# Patient Record
Sex: Female | Born: 1980
Health system: Southern US, Community
[De-identification: ages and names within clinical notes are randomized; demographics above are authoritative.]

## PROBLEM LIST (undated history)

## (undated) DIAGNOSIS — N189 Chronic kidney disease, unspecified: Secondary | ICD-10-CM

## (undated) DIAGNOSIS — M069 Rheumatoid arthritis, unspecified: Secondary | ICD-10-CM

## (undated) DIAGNOSIS — M1990 Unspecified osteoarthritis, unspecified site: Secondary | ICD-10-CM

## (undated) DIAGNOSIS — T7840XA Allergy, unspecified, initial encounter: Secondary | ICD-10-CM

## (undated) HISTORY — PX: CHOLECYSTECTOMY: SHX55

## (undated) HISTORY — DX: Chronic kidney disease, unspecified: N18.9

## (undated) HISTORY — DX: Rheumatoid arthritis, unspecified: M06.9

## (undated) HISTORY — DX: Allergy, unspecified, initial encounter: T78.40XA

---
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
## 2000-01-15 ENCOUNTER — Encounter: Payer: Self-pay | Admitting: Family Medicine

## 2000-01-15 ENCOUNTER — Ambulatory Visit (HOSPITAL_COMMUNITY): Admission: RE | Admit: 2000-01-15 | Discharge: 2000-01-15 | Payer: Self-pay | Admitting: Family Medicine

## 2000-03-04 ENCOUNTER — Ambulatory Visit (HOSPITAL_COMMUNITY): Admission: RE | Admit: 2000-03-04 | Discharge: 2000-03-04 | Payer: Self-pay | Admitting: Gastroenterology

## 2000-03-11 ENCOUNTER — Encounter: Payer: Self-pay | Admitting: Gastroenterology

## 2000-03-11 ENCOUNTER — Emergency Department (HOSPITAL_COMMUNITY): Admission: EM | Admit: 2000-03-11 | Discharge: 2000-03-11 | Payer: Self-pay | Admitting: Emergency Medicine

## 2000-03-12 ENCOUNTER — Inpatient Hospital Stay (HOSPITAL_COMMUNITY): Admission: EM | Admit: 2000-03-12 | Discharge: 2000-03-14 | Payer: Self-pay | Admitting: *Deleted

## 2000-03-12 ENCOUNTER — Ambulatory Visit (HOSPITAL_COMMUNITY): Admission: RE | Admit: 2000-03-12 | Discharge: 2000-03-12 | Payer: Self-pay | Admitting: Gastroenterology

## 2000-03-12 ENCOUNTER — Encounter (INDEPENDENT_AMBULATORY_CARE_PROVIDER_SITE_OTHER): Payer: Self-pay | Admitting: Specialist

## 2000-03-12 ENCOUNTER — Encounter: Payer: Self-pay | Admitting: Gastroenterology

## 2000-03-13 ENCOUNTER — Encounter: Payer: Self-pay | Admitting: Gastroenterology

## 2000-03-13 ENCOUNTER — Encounter: Payer: Self-pay | Admitting: General Surgery

## 2000-07-09 ENCOUNTER — Other Ambulatory Visit: Admission: RE | Admit: 2000-07-09 | Discharge: 2000-07-09 | Payer: Self-pay | Admitting: Obstetrics and Gynecology

## 2001-11-09 ENCOUNTER — Other Ambulatory Visit: Admission: RE | Admit: 2001-11-09 | Discharge: 2001-11-09 | Payer: Self-pay | Admitting: Obstetrics and Gynecology

## 2001-12-13 ENCOUNTER — Encounter: Admission: RE | Admit: 2001-12-13 | Discharge: 2001-12-13 | Payer: Self-pay | Admitting: Family Medicine

## 2001-12-13 ENCOUNTER — Encounter: Payer: Self-pay | Admitting: Family Medicine

## 2005-01-27 ENCOUNTER — Other Ambulatory Visit: Admission: RE | Admit: 2005-01-27 | Discharge: 2005-01-27 | Payer: Self-pay | Admitting: Obstetrics & Gynecology

## 2007-09-23 ENCOUNTER — Inpatient Hospital Stay (HOSPITAL_COMMUNITY): Admission: EM | Admit: 2007-09-23 | Discharge: 2007-09-24 | Payer: Self-pay | Admitting: *Deleted

## 2007-09-23 ENCOUNTER — Ambulatory Visit: Payer: Self-pay | Admitting: Cardiology

## 2007-09-24 ENCOUNTER — Encounter (INDEPENDENT_AMBULATORY_CARE_PROVIDER_SITE_OTHER): Payer: Self-pay | Admitting: Neurology

## 2009-05-09 ENCOUNTER — Emergency Department (HOSPITAL_COMMUNITY): Admission: EM | Admit: 2009-05-09 | Discharge: 2009-05-09 | Payer: Self-pay | Admitting: Emergency Medicine

## 2010-12-15 ENCOUNTER — Encounter: Payer: Self-pay | Admitting: Nephrology

## 2011-04-08 NOTE — H&P (Signed)
NAMEBRECKIN, ZAFAR              ACCOUNT NO.:  [id number]   MEDICAL RECORD NO.:  [id number]          PATIENT TYPE:  INP   LOCATION:  3034                         FACILITY:  [facility]   PHYSICIAN:  Santina Evans A. Orlin Hilding, M.D.DATE OF BIRTH:  09/12/1981   DATE OF ADMISSION:  09/23/2007  DATE OF DISCHARGE:  09/24/2007                              HISTORY & PHYSICAL   CHIEF COMPLAINT:  Tunnel vision, left-sided weakness and numbness, and  headache.  Onset of symptoms was about 5:30 p.m.  Neurological exam took  place at about 7:50 p.m.   HISTORY OF PRESENT ILLNESS:  Ms. Merilynn Finland is a 30 year old, right-  handed white woman with a history of nephritis a few years ago, but no  active disease.  In her usual state of health and doing well.  Today she  had sudden onset of sharp pain in her head, followed by a tunnel vision  and visual distortion with spots and eventually loss of vision in the  left eye or left side.  She cannot be more specific.  This was  associated with some numbness and tingling and mild weakness of the left  side of her body, arm and leg.  Initially she denies any headache, but  later said she did have a sharp pain at the initiation of these symptoms  and then later did develop a headache.  Her symptoms resolved after  about two hours.  She has never had a migraine in the past, although  there is a family history.  She has never had symptoms like this before.  She has never had a stroke.   REVIEW OF SYSTEMS:  A 12-system review is largely negative except for  the symptoms described in the history of present illness.   PAST MEDICAL HISTORY:  Past medical history is significant for:  1. Cholecystectomy.  2. Nephritis.  3. Copperhead snake bite at age 36.   MEDICATIONS:  None routinely except for Yasmin oral contraceptives.   ALLERGIES:  NO KNOWN DRUG ALLERGIES.   SOCIAL HISTORY:  She is married, no children.  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  Quit  smoking two or three years ago.  Rarely  drinks alcohol.  No illicit drug use.   FAMILY HISTORY:  Positive for migraines, a grandparent with a brain  aneurysm, and a great-aunt with a stroke.   PHYSICAL EXAMINATION:  VITAL SIGNS:  Temperature of 98.2.  Pulse of 95.  Blood pressure of 116/74.  Respirations 22.  Had 97% saturation on room  air.  HEENT:  Head normocephalic, atraumatic.  Neck is supple without bruits.  CARDIOVASCULAR:  Regular rate and rhythm.  RESPIRATORY:  Lungs clear to auscultation.  ABDOMEN:  Benign.  EXTREMITIES:  Without edema.  NUTRITIONAL STATUS:  Adequate.  NEUROLOGIC EXAM:  She is alert and cooperative.  She answers questions  correctly, follows commands correctly.  Extraocular movements are intact  with normal gaze.  Visual fields are intact without visual loss.  She  has no facial weakness.  She does not have any facial sensory changes.  On motor exam, there is  no drift in the upper extremities, left or  right.  In the lower extremities, she has a level 1 drift on the left,  no drift on the right.  She has no finger-to-nose or heel-to-shin  ataxia.  Sensory is intact to pinprick, although she subjectively  describes sensory loss, a tingling feeling on the left side.  Language  is normal, there is no aphasia.  There is no dysarthria and no neglect.  On a stroke scale, she scores a 1.   LABORATORY DATA:  CT of the brain is normal, no acute abnormalities are  seen.  Labs are pending at this time.  Modified Rankin score is 0.   ASSESSMENT:  Left-sided weakness and hemisensory loss with visual  disturbance, transient, also associated with headache.  Need to consider  a right-sided stroke versus transient ischemic attack versus migraine.  She has no prior history of such.  She does not have any clear cut risk  factors for a stroke except for some family history, previous cigarette  use, she quit two or three years ago, and oral contraceptives.  She does  not  have a personal history of migraines, but there is a family history  of migraines.  She does have a headache here.   PLAN:  We will admit her for stroke/TIA workup with MRI of the brain,  MRA angiogram and carotid Dopplers, transcranial Doppler, 2D echo,  homocystine level and lipids.  She is not a TPA candidate as her stroke  score is 1 with only minimal drift in the left lower extremity.  She  does not have sufficient deficits to include her ina stroke study.      Catherine A. Orlin Hilding, M.D.  Electronically Signed     CAW/MEDQ  D:  09/23/2007  T:  09/24/2007  Job:  [id number]

## 2011-04-08 NOTE — Discharge Summary (Signed)
NAME:  Sophia Costa, Sophia Costa              ACCOUNT NO.:  [id number]   MEDICAL RECORD NO.:  [id number]          PATIENT TYPE:  INP   LOCATION:  3034                         FACILITY:  [facility]   PHYSICIAN:  Pramod P. Pearlean Brownie, MD    DATE OF BIRTH:  25-Jun-1981   DATE OF ADMISSION:  09/23/2007  DATE OF DISCHARGE:                               DISCHARGE SUMMARY   DIAGNOSES AT TIME OF DISCHARGE:  1. Complicated migraine.  2. Cholecystectomy.  3. Nephritis.  4. Copperhead snake bite at age of 45.   STUDIES PERFORMED:  1. CT of the brain on admission showed no acute abnormality.  2. MRI of the brain showed no acute stroke.  3. Carotid Doppler currently being completed.  4. Transcranial Doppler currently being completed.  5. A 2-D echocardiogram currently being completed.  6. EKG shows normal sinus rhythm with rightward axis.   LABORATORY STUDIES:  CBC with hemoglobin 15.1, otherwise normal.  Chemistry with potassium 3.3, otherwise normal.  Coagulations normal.  Liver function tests normal.  Albumin 3.3.  Cardiac enzymes negative.  Cholesterol 130, triglycerides 62, HDL 60, LDL 58.  Homocysteine  pending.  Hemoglobin A1c 5.0.  Urine pregnancy test negative.  Urine  drug screen negative.  Alcohol level less than 5.   HISTORY OF PRESENT ILLNESS:  Sophia Costa is a 30 year old  Caucasian female with no significant past medical history who presented  the day of admission with sudden onset sharp pain in her head followed  by a tunnel vision, then visual distortion on the left with left-sided  numbness, tingling and weakness.  These symptoms resolved within 2  hours.  She was brought to the hospital for evaluation.  TPA was  considered but not given secondary to quick resolution.  She was  admitted to the hospital for further evaluation.   HOSPITAL COURSE:  MRI was negative for acute stroke.  Given history it  was felt that those symptoms were likely that of a complicated migraine.  She has no  vascular risk factors.  Will complete stroke workup to look  for additional risk factors.  We recommend that she avoid birth control  pills if possible and will schedule an outpatient bubble study and  emboli monitoring as PFO is often associated with migraine.  The patient  is stable for discharge home.   CONDITION AT DISCHARGE:  The patient alert and oriented x3.  No cranial  nerve deficits.  No facial weakness.  No focal deficits.  No sensory  loss.   DISCHARGE PLAN:  1. Discharge home with family.  2. Recommend avoid birth control pills.  3. Outpatient bubble study and emboli monitoring.  4. Follow up with his primary oncologist for birth control pill      options.  5. Follow up with Dr. Delia Heady in 2 months or at time of bubble      study.      Annie Main, N.P.    ______________________________  Sunny Schlein. Pearlean Brownie, MD    SB/MEDQ  D:  09/24/2007  T:  09/24/2007  Job:  [id number]

## 2011-04-11 NOTE — Op Note (Signed)
[place]. [facility]  Patient:    Sophia Costa, Sophia Costa                      MRN: [id number] Proc. Date: 03/13/00 Adm. Date:  [id number] Disc. Date: [id number] Attending:  Otilio Connors Iv Dictator:   Adolph Pollack, M.D. CC:         Anselmo Rod, M.D.             Delorse Lek, M.D.                           Operative Report  PREOPERATIVE DIAGNOSIS:  Chronic cholecystitis.  POSTOPERATIVE DIAGNOSIS:  Chronic cholecystitis.  PROCEDURE:  Laparoscopic cholecystectomy with intraoperative cholangiogram.  SURGEON:  Dr. Abbey Chatters.  ASSISTANT:  Velora Heckler, M.D.  ANESTHESIA:  General.  INDICATIONS:  This 30 year old female has had a 2 to 2-1/2 week history of abdominal pain and nausea and vomiting.  Initially, she said it was in her lower abdomen, but now it is in the right upper quadrant and epigastrium and radiates through to her back.  She has had a negative pregnancy test.  She has had an upper endoscopy which seemed to show a small hiatal hernia, but no evidence of ulcer.  An ultrasound was performed which demonstrated sludge and small gallstones.  Liver function tests were normal.  She now presents for laparoscopic cholecystectomy for what is felt to be symptomatic cholelithiasis and chronic cholecystitis.  DESCRIPTION OF PROCEDURE:  She is placed supine on the operating table, and general anesthetic was administered.  The abdomen was sterilely prepped and draped.  A local anesthetic was infiltrated in the subumbilical region, and a small incision made incising the skin sharply.  The fascia was identified.  A 1 cm incision was made in the fascia.  The peritoneal cavity was entered bluntly and under direct vision.  A pursestring suture of 0 Vicryl was placed around the edges of the fascia.  The Hasson trocar was introduced into the peritoneal cavity, and a pneumoperitoneum created by insufflation of CO2 gas.  Next, the patient was placed  in appropriate position.  Local anesthetic was infiltrated in the epigastrium and two spots in the right upper quadrant.  An 11 mm incision was made in the epigastrium through which a similar sized trocar was placed into the peritoneal cavity under direct vision.  Two 5 mm incisions were made in the right abdomen, to which 5 mm trocars were placed into the peritoneal cavity under direct vision.  The fundus of the gallbladder was grasped, and noted were omental adhesions all the way from the fundus down to the body.  I took these down bluntly.  We then retracted the fundus towards the right shoulder, and grasped the infundibulum and retracted it laterally. Using blunt dissection, I was able to identify the cystic duct and this junction of the gallbladder.  A clip was placed here.  A small incision was made in the cystic duct and bile was refluxed back.  I then placed a cholangiocath through the anterior abdominal wall into the cystic duct to perform the cholangiogram.  With this demonstrated under ______ fluoroscopy was prompt filling of the duodenum with no obvious common bile duct defects. The right and left hepatic duct and some intrahepatic ducts were note to fill without difficulty.  The cholangiocatheter was removed, cystic duct was then clipped three times proximally and divided sharply.  The cystic artery was clipped and then divided with the cautery.  The gallbladder was then dissected free from the liver bed using electrocautery.  Bleeding points from the liver bed were controlled with the cautery.  Once the gallbladder was removed the liver bed was examined and was irrigated with saline solution.  The gallbladder was being removed through the subumbilical port, and the fascial defect closed by tightening up and tying on the pursestring suture.  I then inspected the rest of the abdominal cavity.  When I initially placed the laparoscope into the abdominal cavity I noted there was  some blood tinged fluid in the right gutter, but no obvious blood tinged fluid in the pelvis. When I inspected the pelvis after removing the gallbladder I looked at the adnexa and ovaries and did not see any lesions.  I did not see any fibroids.  After evacuating all of the irrigation fluid, or as much as I could, I remove the trocars and released the pneumoperitoneum.  The skin incisions were then closed with 4-0 monocryl subcuticular stitches followed by Steri-Strips and sterile dressings.  The patient tolerated the procedure well without any apparent complications. She was taken to the recovery room in satisfactory condition. DD:  03/13/00 TD:  03/13/00 Job: [id number]

## 2011-04-11 NOTE — Procedures (Signed)
[name]  Patient:    Sophia Costa, Sophia Costa                      MRN: [id number] Proc. Date: 03/04/00 Adm. Date:  [id number] Attending:  Charna Elizabeth CC:         Kristian Covey, M.D.                           Procedure Report  DATE OF BIRTH:  1981-04-23  REFERRING PHYSICIAN:  Kristian Covey, M.D.  PROCEDURE PERFORMED:  Esophagogastroduodenoscopy.  ENDOSCOPIST:  Anselmo Rod, M.D.  INSTRUMENT USED:  Olympus video panendoscope.  INDICATIONS FOR PROCEDURE:  Epigastric pain with rectal bleeding in an 30 year old white female with a longstanding history of nonsteroidal use secondary to migraine headaches, rule out peptic ulcer disease, esophagitis, gastritis, etc.  PREPROCEDURE PREPARATION:  Informed consent was procured from the patient. The patient was fasted for eight hours prior to the procedure.  PREPROCEDURE PHYSICAL:  The patient had stable vital signs.  Neck supple. Chest clear to auscultation.  S1, S2 regular.  Abdomen soft with normal abdominal bowel sounds.  Epigastric tenderness on palpation with no guarding or rebound, no rigidity, no hepatosplenomegaly.  DESCRIPTION OF PROCEDURE:  The patient was placed in left lateral decubitus position and sedated with 50 mg of Demerol and 10 mg of Versed intravenously. Once the patient was adequately sedated and maintained on low-flow oxygen and continuous cardiac monitoring, the Olympus video panendoscope was advanced through the mouthpiece, over the tongue, into the esophagus under direct vision.  The entire esophagus appeared normal without evidence of ring, stricture, masses, lesions or esophagitis.  The scope was then advanced to the stomach.  A small hiatal hernia was seen on high retroflexion.  There was mild antral gastritis.  No other abnormalities in the form of ulcers, erosions, masses or polyps were seen.  The duodenal bulb and the small bowel distal to the bulb up to 60  cm appeared normal.  There was no outlet obstruction.  The patient tolerated the procedure well without complications.  IMPRESSION: 1. Small hiatal hernia. 2. Mild antral gastritis. 3. No ulcers or erosions seen. 4. Normal proximal small bowel.  RECOMMENDATION: 1. The patient had been advised to refrain from the use of all nonsteroidals. 2. She is to continue her Prevacid for now on a daily basis.  Samples    have been given to her from the office for the next four weeks. 3. Outpatient follow-up is advised in the next two weeks. 4. Consultation with a headache clinic is recommended in the near future. DD:  03/04/00 TD:  03/05/00 Job: [id number]

## 2011-04-11 NOTE — H&P (Signed)
[place]. [facility]  Patient:    Sophia Costa, Sophia Costa                      MRN: [id number] Adm. Date:  [id number] Attending:  Charna Elizabeth Dictator:   Elliot Cousin, M.D.                         History and Physical  CHIEF COMPLAINT:  Persistent nausea and vomiting/hematemesis, with abdominal pain.  HISTORY OF PRESENT ILLNESS:  Sophia Costa is an 30 year old white female who underwent an EGD last week by Dr. Loreta Ave for history of hematemesis and abdominal  pain, thought to be secondary to chronic NSAID use for migraines.  Apparently the EGD was negative.  The patient was admonished to avoid taking NSAIDS.  She states that she complied.  Over the past week she continued to have nausea and some vomiting with red/blood streaks.  She was treated with Phenergan as an outpatient by Dr. Loreta Ave.  Yesterday she became more nauseated and vomited approximately one cup of clear vomitus with blood streaks approximately 45 minutes after eating a ham and cheese sandwich.  She also had "sharp" abdominal pains mostly in the midabdomen, with some radiation to the right and to the left.  Her last bowel movement was yesterday during work, without any signs of melena or bright red blood per rectum. However, she did have a bowel movement yesterday morning that had blood streaks in it after straining.  She denies dysuria, dyspareunia, vaginal discharge, fever, or chills.  She denies current NSAID use.  She denies alcohol use and illicit drug  use.  Her last menstrual period was March 04, 2000.  She does use oral contraceptive agents.  She currently has mild to moderate midabdomen pain, which radiates to the right  upper and left upper quadrants.  She had an episode of vomiting of a bilious-like fluid last night.  She has not had anything to eat or drink by mouth over the past 24 hours.  PAST MEDICAL HISTORY:  1. History of chronic NSAID use.  2. History of hematemesis  secondary to #1.     1. EGD (March 04, 2000) revealed a small hiatal hernia, otherwise normal. o        ulcers seen.  3. Migraine headaches.  4. Status post poisonous snake bite at 30 years old.  5. History of severe throat infection at 30 years of age.  MEDICATIONS:  Oral contraceptive agent.  ALLERGIES:  No known drug allergies.  FAMILY HISTORY:  Her mother is 73 years old.  She had a history of uterine cancer, status post hysterectomy.  Otherwise healthy.  Her father is 41 years of age. e has renal disease, hypertension, and a history of nasal polyps.  Her maternal grandmother has a history of biliary cirrhosis.  SOCIAL HISTORY:  Sophia Costa is single.  She is a Consulting civil engineer at [employer].  She lives ith her mother.  She denies alcohol, drugs, and tobacco use.  REVIEW OF SYSTEMS:  The review of systems is negative for weight loss, fever, chills, chest pain, shortness of breath, rash, and musculoskeletal pain.  PHYSICAL EXAMINATION:  VITAL SIGNS:  Temperature 97.8, heart rate 88, respiratory rate 20, blood pressure 105/56.  GENERAL:  Sophia Costa is an 30 year old white female, who appears pale and mildly uncomfortable, but in no acute distress.  HEENT:  The head is normocephalic, atraumatic.  Pupils are equal, round, and  reactive to light.  Extraocular movements are intact.  Oropharynx is clear with  moist mucous membranes.  NECK:  Supple.  No adenopathy.  HEART:  S1 and S2 with no murmurs, rubs, or gallops.  LUNGS:  Clear to auscultation bilaterally.  ABDOMEN:  Positive bowel sounds.  Soft.  Tender in the midabdomen and mildly tender in the right upper quadrant.  No distention.  No rebound.  No guarding.  RECTAL:  Good tone, brown stool.  Trace guaiac positive.  EXTREMITIES:  Pulses 2+.  No lower extremity edema.  NEUROLOGIC:  The patient was alert and oriented x 3.  Cranial nerves II-XII intact.  LABORATORY:  Ultrasound of the abdomen:  Minimal mobile,  nonshadowing gallstones versus sludge.  No ductal dilatation.  No pericolic fluid.  WBC 9.7, hemoglobin 14.1, platelets 293.  Sodium 143, potassium 3.7, chloride 105, CO2 30, glucose 100, BUN 14, creatinine 0.8, calcium 9.9, total protein 7.3, albumin 3.9, SGOT 25, SGPT 16, alkaline phosphatase 60.  Bilirubin total 0.4.  Urine pregnancy test negative. Urinalysis negative.  Lipase and amylase pending.  ASSESSMENT:  Sophia Costa is an 30 year old white female, who continues to have  persistent abdominal pain with nausea and vomiting, and apparently mild hematemesis.  She is afebrile and does not have a white count.  Her hemoglobin s stable.  PLAN:  1. Admit patient for hydration; pain, nausea, and vomiting management.  2. Check lipase and amylase today.  3. Consultation by Dr. Abbey Chatters for patient evaluation.  The above has been discussed with Dr. Elsie Amis. DD:  03/12/00 TD:  03/12/00 Job: [id number]

## 2011-04-11 NOTE — Consult Note (Signed)
[place]. [facility]  Patient:    Sophia Costa, Sophia Costa                      MRN: [id number] Proc. Date: 03/12/00 Adm. Date:  [id number] Attending:  Charna Elizabeth CC:         Sophia Costa, M.D.             Neysa Bonito, M.D.                          Consultation Report  REASON FOR CONSULTATION:  Abdominal pain, nausea, vomiting.  HISTORY OF PRESENT ILLNESS:  [name] is an 30 year old female who has been  having persistent nausea, vomiting, and intermittent abdominal pain for approximately two weeks.  She says it started with some lower abdominal pain but now is mostly in the epigastric and right upper quadrant.  There is some radiation through to her back.  She was seen by Dr. Loreta Ave and underwent upper endoscopy which demonstrated no ulcer.  There is a small hiatal hernia.  She was started on Prevacid but really has not had any relief from this.  She presented to the emergency department yesterday and was seen.  Liver function tests, white blood  cell count, urinalysis, urine pregnancy test, and amylase and lipase were all unremarkable at that time.  She returned this morning with more nausea, vomiting and pain, and underwent an ultrasound that demonstrated some small gallstones and sludge.  I was asked to see her for this reason.  Dr. Loreta Ave has admitted her for IV fluid hydration and nausea and vomiting management.  PAST MEDICAL HISTORY:  1. Hiatal hernia.  2. Migraine headaches.  PREVIOUS OPERATIONS:  None.  ALLERGIES:  None reported.  MEDICATIONS:  Prevacid and oral contraceptive.  REVIEW OF SYSTEMS:  No known heart disease or hypertension.  Pulmonary:  No chronic lung disease, asthma.  GI:  No yellow jaundice or hepatitis.  PHYSICAL EXAMINATION:  GENERAL:  Thin female in no acute distress, pleasant, cooperative.  VITAL SIGNS:  She is afebrile.  HEENT:  Eyes:  Extraocular motions intact.  Sclerae are clear.  SKIN:  No obvious  jaundice present.  NECK:  Supple without palpable mass.  NODES:  No palpable cervical, supraclavicular or axillary adenopathy.  CARDIOVASCULAR:  Regular rate and rhythm without a murmur.  PULMONARY:  Breath sounds were equal and clear and respirations nonlabored.  ABDOMEN:  Soft.  There is some epigastric and right upper quadrant tenderness.  There is some suprapubic tenderness.  Most of the tenderness seems to be in the  epigastrium.  There are no palpable masses.  No enlarged organs palpable. Active bowel sounds are present.  LABORATORY:  Recent laboratory data pending.  IMPRESSION:  Upper abdominal pain with nausea and vomiting.  The pain, nausea, nd vomiting are fairly persistent.  Upper endoscopy unremarkable.  She does have gallstones.  No signs of acute infection at this time.  This indeed could be biliary colic with sequelae from that.  Other possibilities are not obvious. It does not sound like she has a problem with delayed gastric emptying by history, and in the endoscopy no retained food was noted.  PLAN:  I have offered laparoscopic cholecystectomy to her.  I explained the procedure and the risks, but not limited to bleeding, infection, common bile duct injury, bile leak, small intestinal injury or gastric injury, as well as the risk of general anesthesia.  I told her that there was probably up to an 80% chance his would help her, but there was a chance it would not help her.  She is willing to accept this and wants to proceed with it.  Her mother was present at the same time. DD:  03/12/00 TD:  03/12/00 Job: [id number]

## 2011-04-11 NOTE — Discharge Summary (Signed)
[place]. [facility]  Patient:    Sophia Costa, Sophia Costa                      MRN: [id number] Adm. Date:  [id number] Disc. Date: [id number] Attending:  Arlis Porta CC:         Anselmo Rod, M.D.                           Discharge Summary  PRINCIPAL DISCHARGE DIAGNOSIS:  Chronic cholecystitis.  SECONDARY DIAGNOSIS:  Migraine headaches.  PROCEDURE:  Laparoscopic cholecystectomy with intraoperative cholangiogram, March 13, 2000.  HISTORY OF PRESENT ILLNESS:  This is an 30 year old female admitted by Dr. Anselmo Rod with some mid abdominal pains, nausea, and vomiting. Dr. Loreta Ave has done extensive workup on her.  Ultrasound demonstrates small gallstones and sludge.  Because of her symptoms, she was admitted to Dr. Trilby Drummer service.  (Please refer to dictated history and physical by Dr. Loreta Ave for further details).  HOSPITAL COURSE:  She was hydrated, underwent a laparoscopic cholecystectomy with cholangiogram.  Postoperatively, she had an unremarkable course.  She was stable on postoperative day #1, eating, comfortable, and was able to be discharged.  DISPOSITION:  Discharged to home, March 14, 2000.  DIET:  Low fat.  ACTIVITY:  Limited to no heavy lifting or straining.  No driving for three to five days.  DISCHARGE MEDICATIONS:  She is given Tylox for pain.  FOLLOW-UP:  She will come back and see me in two weeks for follow-up.  She was given an instruction sheet. DD:  04/02/00 TD:  04/04/00 Job: [id number]

## 2011-04-11 NOTE — H&P (Signed)
[place]. [facility]  Patient:    Sophia Costa, Sophia Costa                      MRN: [id number] Adm. Date:  [id number] Attending:  Charna Elizabeth Dictator:   Elliot Cousin, M.D.                         History and Physical  CHIEF COMPLAINT:  Nausea, vomiting, hematemesis with abdominal pain.  HISTORY OF PRESENT ILLNESS:  [name] is an 30 year old white female who underwent an EGD last week by Dr. Loreta Ave for history of hematemesis and abdominal  pain felt to be secondary to chronic NSAID use for migraines DD:  03/12/00 TD:  03/12/00 Job: [id number]

## 2011-05-25 DIAGNOSIS — M069 Rheumatoid arthritis, unspecified: Secondary | ICD-10-CM

## 2011-05-25 HISTORY — DX: Rheumatoid arthritis, unspecified: M06.9

## 2011-06-18 ENCOUNTER — Other Ambulatory Visit: Payer: Self-pay | Admitting: Rheumatology

## 2011-06-18 ENCOUNTER — Ambulatory Visit
Admission: RE | Admit: 2011-06-18 | Discharge: 2011-06-18 | Disposition: A | Discharge status: Home / Self Care | Payer: 59 | Source: Ambulatory Visit | Attending: Rheumatology | Admitting: Rheumatology

## 2011-06-18 DIAGNOSIS — D869 Sarcoidosis, unspecified: Secondary | ICD-10-CM

## 2011-06-18 DIAGNOSIS — A150 Tuberculosis of lung: Secondary | ICD-10-CM

## 2011-09-03 LAB — CK TOTAL AND CKMB (NOT AT ARMC)
CK, MB: 2.3
Relative Index: INVALID
Total CK: 74

## 2011-09-03 LAB — CBC
HCT: 43.4
Hemoglobin: 15.1 — ABNORMAL HIGH
MCHC: 34.9
MCV: 90
Platelets: 258
RBC: 4.82
RDW: 13.1
WBC: 9.2

## 2011-09-03 LAB — APTT: aPTT: 29

## 2011-09-03 LAB — COMPREHENSIVE METABOLIC PANEL
ALT: 14
AST: 19
Albumin: 3.3 — ABNORMAL LOW
Alkaline Phosphatase: 42
BUN: 10
CO2: 22
Calcium: 9
Chloride: 108
Creatinine, Ser: 0.87
GFR calc Af Amer: >60
GFR calc non Af Amer: >60
Glucose, Bld: 85
Potassium: 3.3 — ABNORMAL LOW
Sodium: 140
Total Bilirubin: 0.4
Total Protein: 6.1

## 2011-09-03 LAB — LIPID PANEL
Cholesterol: 130
HDL: 60
LDL Cholesterol: 58
Total CHOL/HDL Ratio: 2.2
Triglycerides: 62
VLDL: 12

## 2011-09-03 LAB — RAPID URINE DRUG SCREEN, HOSP PERFORMED
Amphetamines: NOT DETECTED
Barbiturates: NOT DETECTED
Benzodiazepines: NOT DETECTED
Cocaine: NOT DETECTED
Opiates: NOT DETECTED
Tetrahydrocannabinol: NOT DETECTED

## 2011-09-03 LAB — DIFFERENTIAL
Basophils Absolute: 0
Basophils Relative: 1
Eosinophils Absolute: 0.2
Eosinophils Relative: 2
Lymphocytes Relative: 25
Lymphs Abs: 2.3
Monocytes Absolute: 0.8 — ABNORMAL HIGH
Monocytes Relative: 9
Neutro Abs: 5.8
Neutrophils Relative %: 63

## 2011-09-03 LAB — HEMOGLOBIN A1C
Hgb A1c MFr Bld: 5
Mean Plasma Glucose: 101

## 2011-09-03 LAB — TROPONIN I: Troponin I: 0.02

## 2011-09-03 LAB — BASIC METABOLIC PANEL
BUN: 12
CO2: 23
Calcium: 9.6
Chloride: 105
Creatinine, Ser: 0.84
GFR calc Af Amer: >60
GFR calc non Af Amer: >60
Glucose, Bld: 83
Potassium: 3.5
Sodium: 137

## 2011-09-03 LAB — PREGNANCY, URINE: Preg Test, Ur: NEGATIVE

## 2011-09-03 LAB — ETHANOL: Alcohol, Ethyl (B): <5

## 2011-09-03 LAB — PROTIME-INR
INR: 1
Prothrombin Time: 13.1

## 2011-11-12 ENCOUNTER — Ambulatory Visit: Payer: 59

## 2011-11-12 DIAGNOSIS — J209 Acute bronchitis, unspecified: Secondary | ICD-10-CM

## 2012-06-18 ENCOUNTER — Ambulatory Visit: Payer: 59 | Admitting: Family Medicine

## 2012-06-25 ENCOUNTER — Ambulatory Visit: Payer: 59 | Admitting: Family Medicine

## 2012-07-02 ENCOUNTER — Ambulatory Visit (INDEPENDENT_AMBULATORY_CARE_PROVIDER_SITE_OTHER): Payer: 59 | Admitting: Family Medicine

## 2012-07-02 ENCOUNTER — Encounter: Payer: Self-pay | Admitting: Family Medicine

## 2012-07-02 VITALS — BP 104/72 | HR 84 | Temp 98.3°F | Ht 64.0 in | Wt 132.0 lb

## 2012-07-02 DIAGNOSIS — M069 Rheumatoid arthritis, unspecified: Secondary | ICD-10-CM | POA: Insufficient documentation

## 2012-07-02 DIAGNOSIS — R5381 Other malaise: Secondary | ICD-10-CM

## 2012-07-02 DIAGNOSIS — R319 Hematuria, unspecified: Secondary | ICD-10-CM | POA: Insufficient documentation

## 2012-07-02 DIAGNOSIS — R5383 Other fatigue: Secondary | ICD-10-CM

## 2012-07-02 DIAGNOSIS — R6889 Other general symptoms and signs: Secondary | ICD-10-CM

## 2012-07-02 NOTE — Progress Notes (Signed)
  Subjective:    Patient ID: Sophia Costa, female    DOB: [birth year], 31 y.o.   MRN: [id number]  HPI  New to establish. Past medical history of rheumatoid arthritis diagnosed July 2012. Followed closely by rheumatology. Treated with methotrexate and Enbrel. Fairly well controlled. She's had some acne issues which flared up after she started treatment for rheumatoid and takes Keflex for that. She has history of chronic microscopic hematuria which has been fully evaluated in the past. This is hereditary. Previous cholecystectomy 2001. No other surgeries. Takes several supplements.  Patient is divorced. Nonsmoker. No regular alcohol. Works in orthopedic office.  Family history significant sister IgA nephropathy. Hypertension in parents.  Patient's had recent issues of cold intolerance and coarse thickening of hair. No constipation. Increased fatigue issues. Requesting thyroid check. Never had lipids assessed.  Past Medical History  Diagnosis Date  . Allergy   . Chronic kidney disease     microscopic hematuria  . Rheumatoid arthritis 7/12   Past Surgical History  Procedure Date  . Cholecystectomy     reports that she has never smoked. She does not have any smokeless tobacco history on file. She reports that she does not drink alcohol or use illicit drugs. family history includes Arthritis in her mother and paternal grandmother; Diabetes in her paternal grandfather; Heart disease in her paternal grandfather; Hypertension in her mother; and Kidney disease in her father and sister. No Known Allergies    Review of Systems  Constitutional: Positive for fatigue. Negative for fever, chills, appetite change and unexpected weight change.  Eyes: Negative for visual disturbance.  Respiratory: Negative for cough and shortness of breath.   Cardiovascular: Negative for chest pain and leg swelling.  Gastrointestinal: Negative for nausea, vomiting and abdominal pain.  Genitourinary: Negative for  dysuria.  Musculoskeletal: Positive for arthralgias.  Neurological: Negative for dizziness and headaches.       Objective:   Physical Exam  Constitutional: She is oriented to person, place, and time. She appears well-developed and well-nourished.  HENT:  Mouth/Throat: Oropharynx is clear and moist.  Neck: Neck supple. No thyromegaly present.  Cardiovascular: Normal rate and regular rhythm.   Pulmonary/Chest: Effort normal and breath sounds normal. No respiratory distress. She has no wheezes. She has no rales.  Musculoskeletal: She exhibits no edema.  Neurological: She is alert and oriented to person, place, and time.          Assessment & Plan:  #1 increased cold intolerance, fatigue, and coarse hair changes. Check TSH to rule out hypothyroidism. #2 rheumatoid arthritis treated with Enbrel and methotrexate.  #3 mild acne. Controlled with Keflex.  #4 history of chronic microscopic hematuria

## 2012-07-03 LAB — VITAMIN D 25 HYDROXY (VIT D DEFICIENCY, FRACTURES): Vit D, 25-Hydroxy: 55 ng/mL (ref 30–89)

## 2012-07-03 LAB — TSH: TSH: 1.239 u[IU]/mL (ref 0.350–4.500)

## 2012-07-03 LAB — LIPID PANEL
Cholesterol: 142 mg/dL (ref 0–200)
HDL: 80 mg/dL (ref >39)
LDL Cholesterol: 57 mg/dL (ref 0–99)
Total CHOL/HDL Ratio: 1.8 Ratio
Triglycerides: 27 mg/dL (ref <150)
VLDL: 5 mg/dL (ref 0–40)

## 2012-07-06 NOTE — Progress Notes (Signed)
Quick Note:  Pt informed ______ 

## 2013-02-10 ENCOUNTER — Ambulatory Visit (INDEPENDENT_AMBULATORY_CARE_PROVIDER_SITE_OTHER): Payer: BC Managed Care – PPO | Admitting: Internal Medicine

## 2013-02-10 ENCOUNTER — Encounter: Payer: Self-pay | Admitting: Internal Medicine

## 2013-02-10 VITALS — BP 100/60 | HR 95 | Temp 98.5°F | Resp 16 | Wt 136.0 lb

## 2013-02-10 DIAGNOSIS — J309 Allergic rhinitis, unspecified: Secondary | ICD-10-CM

## 2013-02-10 DIAGNOSIS — M069 Rheumatoid arthritis, unspecified: Secondary | ICD-10-CM

## 2013-02-10 MED ORDER — MONTELUKAST SODIUM 10 MG PO TABS: 10.0000 mg | ORAL_TABLET | Freq: Every day | ORAL | Status: DC

## 2013-02-10 MED ORDER — AMOXICILLIN-POT CLAVULANATE 875-125 MG PO TABS: 1.0000 | ORAL_TABLET | Freq: Two times a day (BID) | ORAL | Status: DC

## 2013-02-10 NOTE — Progress Notes (Signed)
Subjective:    Patient ID: Sophia Costa, female    DOB: 08-11-1981, 32 y.o.   MRN: [id number]  HPI  32 year old patient has chronic medical problems include rheumatoid arthritis. This is treated with methotrexate as well as Remicade. Presently she is on cephalexin do to recurrent skin and soft tissue infections and has been on this antibiotic for greater than one year now at a 500 mg daily dose. For the past week she's had worsening sinus pressure and pain she describes dental and generalized facial discomfort and some purulent mucus drainage. She has bilateral ear pain. She has allergic rhinitis and has been on maintenance Zyrtec. She has been treated intermittently with nasal steroids in the past. She has chronic allergy symptoms  Past Medical History  Diagnosis Date  . Allergy   . Chronic kidney disease     microscopic hematuria  . Rheumatoid arthritis 7/12    History   Social History  . Marital Status: Legally Separated    Spouse Name: N/A    Number of Children: N/A  . Years of Education: N/A   Occupational History  . Not on file.   Social History Main Topics  . Smoking status: Never Smoker   . Smokeless tobacco: Not on file  . Alcohol Use: No  . Drug Use: No  . Sexually Active: Not on file   Other Topics Concern  . Not on file   Social History Narrative  . No narrative on file    Past Surgical History  Procedure Laterality Date  . Cholecystectomy      Family History  Problem Relation Age of Onset  . Arthritis Mother   . Hypertension Mother   . Kidney disease Father   . Kidney disease Sister     IgA nephropathy  . Arthritis Paternal Grandmother   . Heart disease Paternal Grandfather   . Diabetes Paternal Grandfather     No Known Allergies  Current Outpatient Prescriptions on File Prior to Visit  Medication Sig Dispense Refill  . ACZONE 5 % topical gel Once daily.      . calcium citrate-vitamin D (CITRACAL+D) 315-200 MG-UNIT per tablet Take 2  tablets by mouth daily.      . celecoxib (CELEBREX) 200 MG capsule Take 200 mg by mouth 2 (two) times daily.      . cephALEXin (KEFLEX) 500 MG capsule Take 1 capsule by mouth daily.      . cetirizine (ZYRTEC) 10 MG tablet Take 10 mg by mouth daily.      . Cholecalciferol (VITAMIN D3) 1000 UNITS CAPS Take 1 capsule by mouth daily.      . fish oil-omega-3 fatty acids 1000 MG capsule Take 2 g by mouth daily.      . folic acid (FOLVITE) 1 MG tablet Take 1 tablet by mouth daily.      Marland Kitchen glucosamine-chondroitin 500-400 MG tablet Take 1 tablet by mouth 3 (three) times daily.      . methotrexate (RHEUMATREX) 2.5 MG tablet Take 8 tablets by mouth Once a week.        No current facility-administered medications on file prior to visit.    BP 100/60  Pulse 95  Temp(Src) 98.5 F (36.9 C) (Oral)  Resp 16  Wt 136 lb (61.689 kg)  BMI 23.33 kg/m2  SpO2 98%       Review of Systems  Constitutional: Positive for fatigue.  HENT: Positive for congestion, rhinorrhea, postnasal drip and sinus pressure. Negative for hearing loss, sore  throat, dental problem and tinnitus.   Eyes: Positive for itching. Negative for pain, discharge and visual disturbance.  Respiratory: Negative for cough and shortness of breath.   Cardiovascular: Negative for chest pain, palpitations and leg swelling.  Gastrointestinal: Negative for nausea, vomiting, abdominal pain, diarrhea, constipation, blood in stool and abdominal distention.  Genitourinary: Negative for dysuria, urgency, frequency, hematuria, flank pain, vaginal bleeding, vaginal discharge, difficulty urinating, vaginal pain and pelvic pain.  Musculoskeletal: Negative for joint swelling, arthralgias and gait problem.  Skin: Negative for rash.  Neurological: Negative for dizziness, syncope, speech difficulty, weakness, numbness and headaches.  Hematological: Negative for adenopathy.  Psychiatric/Behavioral: Negative for behavioral problems, dysphoric mood and  agitation. The patient is not nervous/anxious.        Objective:   Physical Exam  Constitutional: She is oriented to person, place, and time. She appears well-developed and well-nourished.  HENT:  Head: Normocephalic.  Right Ear: External ear normal.  Left Ear: External ear normal.  Erythema of the oropharynx without exudate pansinus tenderness  Eyes: Conjunctivae and EOM are normal. Pupils are equal, round, and reactive to light.  Mild conjunctival injection  Neck: Normal range of motion. Neck supple. No thyromegaly present.  Cardiovascular: Normal rate, regular rhythm, normal heart sounds and intact distal pulses.   Pulmonary/Chest: Effort normal and breath sounds normal.  Abdominal: Soft. Bowel sounds are normal. She exhibits no mass. There is no tenderness.  Musculoskeletal: Normal range of motion.  Lymphadenopathy:    She has no cervical adenopathy.  Neurological: She is alert and oriented to person, place, and time.  Skin: Skin is warm and dry. No rash noted.  Psychiatric: She has a normal mood and affect. Her behavior is normal.          Assessment & Plan:   Allergic rhinitis. Will treat aggressively for subacute sinusitis in view of her chronic medical condition. Will hold cephalexin and treat with Augmentin for 10 days We'll consider a trial of Singulair due to her chronic sinus symptoms

## 2013-02-10 NOTE — Patient Instructions (Signed)
Hold cephalexin until the Augmentin has been completed   Use saline irrigation, warm  moist compresses and over-the-counter decongestants only as directed.  Call if there is no improvement in 5 to 7 days, or sooner if you develop increasing pain, fever, or any new symptoms.  Use nasal spray daily  Singulair one daily  Take your antibiotic as prescribed until ALL of it is gone, but stop if you develop a rash, swelling, or any side effects of the medication.  Contact our office as soon as possible if  there are side effects of the medication.

## 2013-04-29 ENCOUNTER — Ambulatory Visit (INDEPENDENT_AMBULATORY_CARE_PROVIDER_SITE_OTHER): Payer: BC Managed Care – PPO | Admitting: Family Medicine

## 2013-04-29 ENCOUNTER — Encounter: Payer: Self-pay | Admitting: Family Medicine

## 2013-04-29 ENCOUNTER — Telehealth: Payer: Self-pay | Admitting: Family Medicine

## 2013-04-29 VITALS — BP 108/76 | Temp 98.3°F | Wt 135.0 lb

## 2013-04-29 DIAGNOSIS — F329 Major depressive disorder, single episode, unspecified: Secondary | ICD-10-CM

## 2013-04-29 DIAGNOSIS — F32A Depression, unspecified: Secondary | ICD-10-CM

## 2013-04-29 MED ORDER — CITALOPRAM HYDROBROMIDE 20 MG PO TABS: 20.0000 mg | ORAL_TABLET | Freq: Every day | ORAL | Status: DC

## 2013-04-29 NOTE — Telephone Encounter (Signed)
Patient Information:  Caller Name: Rayma  Phone: [phone]  Patient: Sophia Costa, Sophia Costa  Gender: Female  DOB: 1981-11-13  Age: 32 Years  PCP: Evelena Peat [facility])  Pregnant: No  Office Follow Up:  Does the office need to follow up with this patient?: No  Instructions For The Office: N/A  RN Note:  Mirena IUD.  No longer on Methotrexate; getting Remicaid infusions for RA.  Advised to see MD within 72 hours for frequent or longer crying spells per Anxiety guideline. 30 minute appointment scheduled for today with Dr Selena Batten.   Symptoms  Reason For Call & Symptoms: Anxiety or Depression for past 6-12 months but worse in last 2 months. Reports newly diagnosed with RA, stress from work, increased crying.  Reviewed Health History In EMR: Yes  Reviewed Medications In EMR: Yes  Reviewed Allergies In EMR: Yes  Reviewed Surgeries / Procedures: Yes  Date of Onset of Symptoms: 02/27/2013 OB / GYN:  LMP: 04/14/2013  Guideline(s) Used:  No Protocol Available - Sick Adult  Disposition Per Guideline:   See Within 3 Days in Office  Reason For Disposition Reached:   Nursing judgment  Advice Given:  Call Back If:  You become worse.  Patient Will Follow Care Advice:  YES  Appointment Scheduled:  04/29/2013 15:45:00 Appointment Scheduled Provider:  Kriste Basque [facility])

## 2013-04-29 NOTE — Patient Instructions (Addendum)
-  start celexa daily  -regular exercise - start with walking  -see counselor if you can  -follow up in 1 month

## 2013-04-29 NOTE — Progress Notes (Signed)
Chief Complaint  Patient presents with  . Anxiety and Depression    per patient has gotten worse x last 2 months     HPI:  32 yo F patient of Dr. Lucie Leather here for acute visit for depression: -started about 1 year ago, anxiety and depression -stress related to dx of RA (followed by rheum), work issues, recent move -symptoms: depressed mood, frequent crying spells, feeling foggy, anxious, emotional, fatigue, when depressed diet is poor and doesn't feel like eating -sleep is ok -had some depression 4-5 years ago after separation and saw a counselor, no medications at the time -reports on celexa for abut 1 year in highschool for depression -never hospitalized for her anxiety or depression -has good support from boyfriend and mom, Ephriam Knuckles - but has not been attending church -no regular exercise  ROS: See pertinent positives and negatives per HPI.  Past Medical History  Diagnosis Date  . Allergy   . Chronic kidney disease     microscopic hematuria  . Rheumatoid arthritis(714.0) 7/12    Family History  Problem Relation Age of Onset  . Arthritis Mother   . Hypertension Mother   . Kidney disease Father   . Kidney disease Sister     IgA nephropathy  . Arthritis Paternal Grandmother   . Heart disease Paternal Grandfather   . Diabetes Paternal Grandfather     History   Social History  . Marital Status: Legally Separated    Spouse Name: N/A    Number of Children: N/A  . Years of Education: N/A   Social History Main Topics  . Smoking status: Never Smoker   . Smokeless tobacco: None  . Alcohol Use: No  . Drug Use: No  . Sexually Active: None   Other Topics Concern  . None   Social History Narrative  . None    Current outpatient prescriptions:ACZONE 5 % topical gel, Once daily., Disp: , Rfl: ;  calcium citrate-vitamin D (CITRACAL+D) 315-200 MG-UNIT per tablet, Take 2 tablets by mouth daily., Disp: , Rfl: ;  celecoxib (CELEBREX) 200 MG capsule, Take 200 mg by mouth  2 (two) times daily., Disp: , Rfl: ;  cephALEXin (KEFLEX) 500 MG capsule, Take 1 capsule by mouth daily., Disp: , Rfl:  cetirizine (ZYRTEC) 10 MG tablet, Take 10 mg by mouth daily., Disp: , Rfl: ;  Cholecalciferol (VITAMIN D3) 1000 UNITS CAPS, Take 1 capsule by mouth daily., Disp: , Rfl: ;  fish oil-omega-3 fatty acids 1000 MG capsule, Take 2 g by mouth daily., Disp: , Rfl: ;  folic acid (FOLVITE) 1 MG tablet, Take 1 tablet by mouth daily., Disp: , Rfl: ;  glucosamine-chondroitin 500-400 MG tablet, Take 1 tablet by mouth 3 (three) times daily., Disp: , Rfl:  methotrexate (RHEUMATREX) 2.5 MG tablet, Take 8 tablets by mouth Once a week. , Disp: , Rfl: ;  montelukast (SINGULAIR) 10 MG tablet, Take 1 tablet (10 mg total) by mouth at bedtime., Disp: 30 tablet, Rfl: 3;  sodium chloride 0.9 % SOLN with inFLIXimab 100 MG SOLR, Inject 5 mg/kg into the vein every 8 (eight) weeks., Disp: , Rfl:  amoxicillin-clavulanate (AUGMENTIN) 875-125 MG per tablet, Take 1 tablet by mouth 2 (two) times daily., Disp: 20 tablet, Rfl: 0;  citalopram (CELEXA) 20 MG tablet, Take 1 tablet (20 mg total) by mouth daily., Disp: 30 tablet, Rfl: 1  EXAM:  Filed Vitals:   04/29/13 1554  BP: 108/76  Temp: 98.3 F (36.8 C)    Body mass index is  23.16 kg/(m^2).  GENERAL: vitals reviewed and listed above, alert, oriented, appears well hydrated and in no acute distress  HEENT: atraumatic, conjunttiva clear, no obvious abnormalities on inspection of external nose and ears  NECK: no obvious masses on inspection  LUNGS: clear to auscultation bilaterally, no wheezes, rales or rhonchi, good air movement  CV: HRRR, no peripheral edema  MS: moves all extremities without noticeable abnormality  PSYCH: pleasant and cooperative, depressed mood, tearful  ASSESSMENT AND PLAN:  Discussed the following assessment and plan:  Depression - Plan: citalopram (CELEXA) 20 MG tablet  -discussed options including counseling and medications and  risks/benefits -she would like to restart celexa, risks discussed -discussed counseling, church support and regular exercise may be beneficial - info provided for counseling -follow up in one month - sooner if worsening or other concerns -Patient advised to return or notify a doctor immediately if symptoms worsen or persist or new concerns arise. ->25 minutes spent face to face  There are no Patient Instructions on file for this visit.   Sophia Basque R.

## 2013-06-03 ENCOUNTER — Ambulatory Visit: Payer: BC Managed Care – PPO | Admitting: Family Medicine

## 2013-06-10 ENCOUNTER — Encounter: Payer: Self-pay | Admitting: Family Medicine

## 2013-06-10 ENCOUNTER — Ambulatory Visit (INDEPENDENT_AMBULATORY_CARE_PROVIDER_SITE_OTHER): Payer: BC Managed Care – PPO | Admitting: Family Medicine

## 2013-06-10 VITALS — BP 102/64 | HR 78 | Temp 98.3°F | Wt 139.0 lb

## 2013-06-10 DIAGNOSIS — J3089 Other allergic rhinitis: Secondary | ICD-10-CM

## 2013-06-10 DIAGNOSIS — J309 Allergic rhinitis, unspecified: Secondary | ICD-10-CM

## 2013-06-10 DIAGNOSIS — F329 Major depressive disorder, single episode, unspecified: Secondary | ICD-10-CM

## 2013-06-10 DIAGNOSIS — F32A Depression, unspecified: Secondary | ICD-10-CM

## 2013-06-10 MED ORDER — CITALOPRAM HYDROBROMIDE 20 MG PO TABS: 20.0000 mg | ORAL_TABLET | Freq: Every day | ORAL | Status: DC

## 2013-06-10 MED ORDER — SCOPOLAMINE 1 MG/3DAYS TD PT72: 1.0000 | MEDICATED_PATCH | TRANSDERMAL | Status: DC

## 2013-06-10 MED ORDER — MONTELUKAST SODIUM 10 MG PO TABS: 10.0000 mg | ORAL_TABLET | Freq: Every day | ORAL | Status: DC

## 2013-06-10 MED ORDER — AZELASTINE HCL 0.1 % NA SOLN: 2.0000 | Freq: Two times a day (BID) | NASAL | Status: DC

## 2013-06-10 NOTE — Progress Notes (Signed)
  Subjective:    Patient ID: Sophia Costa, female    DOB: 1981/09/15, 32 y.o.   MRN: [id number]  HPI Followup multiple issues as below  Recent recurrent depression. This would make second episode. She had responded previously to Celexa and was started back on Celexa 20 mg daily. She is seeing great improvement in mood since starting this. Fewer crying spells. Good sleep. Appetite normal. More initiative.  Patient has rheumatoid arthritis and recent change of therapy with Remicade which is helped greatly with her arthritis pains. She thinks this has helped her mood as well. She's been able to start back exercises which has also helped. She has decided against any counseling at this time. No suicidal ideation.  She has perennial allergies. She takes over-the-counter or antihistamines and recent addition of Singulair. Singulair has helped but she still has some nasal congestion and postnasal drip even with this. She has previously used nasal steroids with mild relief  Past Medical History  Diagnosis Date  . Allergy   . Chronic kidney disease     microscopic hematuria  . Rheumatoid arthritis(714.0) 7/12   Past Surgical History  Procedure Laterality Date  . Cholecystectomy      reports that she has never smoked. She does not have any smokeless tobacco history on file. She reports that she does not drink alcohol or use illicit drugs. family history includes Arthritis in her mother and paternal grandmother; Diabetes in her paternal grandfather; Heart disease in her paternal grandfather; Hypertension in her mother; and Kidney disease in her father and sister. No Known Allergies    Review of Systems  Constitutional: Negative for fever, chills and fatigue.  HENT: Positive for congestion, rhinorrhea, postnasal drip and sinus pressure.   Respiratory: Negative for cough and shortness of breath.   Cardiovascular: Negative for chest pain.  Psychiatric/Behavioral: Negative for suicidal ideas,  dysphoric mood and agitation. The patient is not nervous/anxious.        Objective:   Physical Exam  Constitutional: She appears well-developed and well-nourished.  HENT:  Right Ear: External ear normal.  Left Ear: External ear normal.  Mouth/Throat: Oropharynx is clear and moist.  Cardiovascular: Normal rate and regular rhythm.   Pulmonary/Chest: Effort normal and breath sounds normal. No respiratory distress. She has no wheezes. She has no rales.  Psychiatric: She has a normal mood and affect. Her behavior is normal. Thought content normal.          Assessment & Plan:  Maj. depressive episode, recurrent. Greatly improved.  Continue Celexa 20 mg daily for minimum of 6-9 months use. Reassess in 6 months and we will decide at that point whether to continue  Perennial allergic rhinitis. Suboptimal control. Continue Singulair and antihistamine. Add Astelin nasal 1-2 sprays per nostril twice daily as needed

## 2013-10-25 ENCOUNTER — Encounter: Payer: Self-pay | Admitting: Family

## 2013-10-25 ENCOUNTER — Ambulatory Visit (INDEPENDENT_AMBULATORY_CARE_PROVIDER_SITE_OTHER): Payer: BC Managed Care – PPO | Admitting: Family

## 2013-10-25 VITALS — BP 98/60 | HR 77 | Temp 98.0°F | Wt 136.0 lb

## 2013-10-25 DIAGNOSIS — J029 Acute pharyngitis, unspecified: Secondary | ICD-10-CM

## 2013-10-25 MED ORDER — PREDNISONE 20 MG PO TABS: 40.0000 mg | ORAL_TABLET | Freq: Every day | ORAL | Status: AC

## 2013-10-25 NOTE — Patient Instructions (Signed)

## 2013-10-25 NOTE — Progress Notes (Signed)
Subjective:    Patient ID: Sophia Costa, female    DOB: 23-Aug-1981, 32 y.o.   MRN: [id number]  HPI 32 year old white female, nonsmoker, patient of Dr. Caryl Never is in today with complaints of sore throat, cough, congestion, headache x1 week and worsening. Reports that she's been taking Tessalon Perles, Singulair and Zyrtec daily. She has a history of allergic rhinitis. Denies any fever, muscle aches or pain. She has hoarseness.   Review of Systems  Constitutional: Negative.   HENT: Positive for congestion, postnasal drip, rhinorrhea, sore throat and voice change.   Eyes: Negative.   Respiratory: Positive for cough.   Cardiovascular: Negative.   Musculoskeletal: Negative.   Skin: Negative.   Allergic/Immunologic: Positive for environmental allergies. Negative for food allergies.  Neurological: Positive for headaches. Negative for dizziness.  Hematological: Negative.   Psychiatric/Behavioral: Negative.    Past Medical History  Diagnosis Date  . Allergy   . Chronic kidney disease     microscopic hematuria  . Rheumatoid arthritis(714.0) 7/12    History   Social History  . Marital Status: Legally Separated    Spouse Name: N/A    Number of Children: N/A  . Years of Education: N/A   Occupational History  . Not on file.   Social History Main Topics  . Smoking status: Never Smoker   . Smokeless tobacco: Not on file  . Alcohol Use: No  . Drug Use: No  . Sexual Activity: Not on file   Other Topics Concern  . Not on file   Social History Narrative  . No narrative on file    Past Surgical History  Procedure Laterality Date  . Cholecystectomy      Family History  Problem Relation Age of Onset  . Arthritis Mother   . Hypertension Mother   . Kidney disease Father   . Kidney disease Sister     IgA nephropathy  . Arthritis Paternal Grandmother   . Heart disease Paternal Grandfather   . Diabetes Paternal Grandfather     No Known Allergies  Current Outpatient  Prescriptions on File Prior to Visit  Medication Sig Dispense Refill  . ACZONE 5 % topical gel Once daily.      Marland Kitchen azelastine (ASTELIN) 137 MCG/SPRAY nasal spray Place 2 sprays into the nose 2 (two) times daily. Use in each nostril as directed  30 mL  12  . calcium citrate-vitamin D (CITRACAL+D) 315-200 MG-UNIT per tablet Take 2 tablets by mouth daily.      . celecoxib (CELEBREX) 200 MG capsule Take 200 mg by mouth 2 (two) times daily.      . cephALEXin (KEFLEX) 500 MG capsule Take 1 capsule by mouth daily.      . cetirizine (ZYRTEC) 10 MG tablet Take 10 mg by mouth daily.      . Cholecalciferol (VITAMIN D3) 1000 UNITS CAPS Take 1 capsule by mouth daily.      . citalopram (CELEXA) 20 MG tablet Take 1 tablet (20 mg total) by mouth daily.  30 tablet  11  . fish oil-omega-3 fatty acids 1000 MG capsule Take 2 g by mouth daily.      . folic acid (FOLVITE) 1 MG tablet Take 1 tablet by mouth daily.      Marland Kitchen glucosamine-chondroitin 500-400 MG tablet Take 1 tablet by mouth 3 (three) times daily.      . methotrexate (RHEUMATREX) 2.5 MG tablet Take 8 tablets by mouth Once a week.       Marland Kitchen  montelukast (SINGULAIR) 10 MG tablet Take 1 tablet (10 mg total) by mouth at bedtime.  30 tablet  11  . scopolamine (TRANSDERM-SCOP) 1.5 MG Place 1 patch (1.5 mg total) onto the skin every 3 (three) days.  10 patch  0  . sodium chloride 0.9 % SOLN with inFLIXimab 100 MG SOLR Inject 5 mg/kg into the vein every 8 (eight) weeks.      Marland Kitchen amoxicillin-clavulanate (AUGMENTIN) 875-125 MG per tablet Take 1 tablet by mouth 2 (two) times daily.  20 tablet  0   No current facility-administered medications on file prior to visit.    BP 98/60  Pulse 77  Temp(Src) 98 F (36.7 C) (Oral)  Wt 136 lb (61.689 kg)chart    Objective:   Physical Exam  Constitutional: She is oriented to person, place, and time. She appears well-developed and well-nourished.  HENT:  Right Ear: External ear normal.  Left Ear: External ear normal.  Pharynx  moderate erythema  Neck: Normal range of motion. Neck supple.  Cardiovascular: Normal rate and regular rhythm.   Pulmonary/Chest: Effort normal and breath sounds normal.  Musculoskeletal: Normal range of motion.  Lymphadenopathy:    She has no cervical adenopathy.  Neurological: She is alert and oriented to person, place, and time.  Skin: Skin is warm and dry.  Psychiatric: She has a normal mood and affect.          Assessment & Plan:  Assessment: 1. Upper respiratory infection 2. Pharyngitis 3. Allergic rhinitis  Plan: Prednisone 40 mg daily x5 days. Rest. Drink plenty of fluids. Salt water gargles. Patient on the opposite symptoms worsen or persist. Recheck as scheduled, and as needed.

## 2013-11-25 ENCOUNTER — Encounter: Payer: Self-pay | Admitting: Family Medicine

## 2013-11-25 ENCOUNTER — Ambulatory Visit (INDEPENDENT_AMBULATORY_CARE_PROVIDER_SITE_OTHER): Payer: BC Managed Care – PPO | Admitting: Family Medicine

## 2013-11-25 VITALS — BP 102/78 | HR 78 | Temp 98.1°F | Resp 16 | Wt 142.4 lb

## 2013-11-25 DIAGNOSIS — R6889 Other general symptoms and signs: Secondary | ICD-10-CM

## 2013-11-25 LAB — POCT INFLUENZA A/B
Influenza A, POC: NEGATIVE
Influenza B, POC: NEGATIVE

## 2013-11-25 MED ORDER — ONDANSETRON 4 MG PO TBDP: 4.0000 mg | ORAL_TABLET | Freq: Three times a day (TID) | ORAL | Status: DC | PRN

## 2013-11-25 MED ORDER — OSELTAMIVIR PHOSPHATE 75 MG PO CAPS: 75.0000 mg | ORAL_CAPSULE | Freq: Two times a day (BID) | ORAL | Status: DC

## 2013-11-25 NOTE — Progress Notes (Signed)
Pre visit review using our clinic review tool, if applicable. No additional management support is needed unless otherwise documented below in the visit note. 

## 2013-11-25 NOTE — Patient Instructions (Signed)
Follow up as needed Drink plenty of fluids REST! Start the Tamiflu twice daily Use the Zofran for nausea/vomiting (will also slow bowels) Immodium as needed for diarrhea Alternate tylenol/ibuprofen for body aches/fever Call with any questions or concerns Hang in there! Happy New Year!

## 2013-11-25 NOTE — Progress Notes (Signed)
   Subjective:    Patient ID: Sophia Costa, female    DOB: 1981-02-23, 33 y.o.   MRN: [id number]  HPI Nausea/vomiting- sxs started 2 nights ago, started suddenly.  + body aches, vomiting, diarrhea, chills.  Pt on Methotrexate and on Remicade infusions.  + flu contacts.  'horrible HA'.  No ear pain.  Minimal cough.   Review of Systems For ROS see HPI     Objective:   Physical Exam  Vitals reviewed. Constitutional: She appears well-developed and well-nourished. No distress.  Obviously not feeling well  HENT:  Head: Normocephalic and atraumatic.  Right Ear: Tympanic membrane normal.  Left Ear: Tympanic membrane normal.  Nose: Mucosal edema and rhinorrhea present. Right sinus exhibits no maxillary sinus tenderness and no frontal sinus tenderness. Left sinus exhibits no maxillary sinus tenderness and no frontal sinus tenderness.  Mouth/Throat: Uvula is midline and mucous membranes are normal. Posterior oropharyngeal erythema present. No oropharyngeal exudate.  Eyes: Conjunctivae and EOM are normal. Pupils are equal, round, and reactive to light.  Neck: Normal range of motion. Neck supple.  Cardiovascular: Normal rate, regular rhythm and normal heart sounds.   Pulmonary/Chest: Effort normal and breath sounds normal. No respiratory distress. She has no wheezes.  + dry cough  Lymphadenopathy:    She has no cervical adenopathy.  Skin: Skin is warm.          Assessment & Plan:

## 2013-11-27 NOTE — Assessment & Plan Note (Signed)
New.  Pt's sxs and known flu exposure consistent w/ influenza despite negative rapid flu test.  Given pt's weakened immune system due to methotrexate and Remicade will start Tamiflu.  Zofran prn.  Reviewed supportive care and red flags that should prompt return.  Pt expressed understanding and is in agreement w/ plan.

## 2013-12-09 ENCOUNTER — Ambulatory Visit: Payer: BC Managed Care – PPO | Admitting: Family Medicine

## 2013-12-16 ENCOUNTER — Encounter: Payer: Self-pay | Admitting: Family Medicine

## 2013-12-16 ENCOUNTER — Ambulatory Visit (INDEPENDENT_AMBULATORY_CARE_PROVIDER_SITE_OTHER): Payer: BC Managed Care – PPO | Admitting: Family Medicine

## 2013-12-16 VITALS — BP 120/68 | HR 96 | Temp 98.4°F | Wt 148.0 lb

## 2013-12-16 DIAGNOSIS — F32A Depression, unspecified: Secondary | ICD-10-CM

## 2013-12-16 DIAGNOSIS — F329 Major depressive disorder, single episode, unspecified: Secondary | ICD-10-CM

## 2013-12-16 DIAGNOSIS — F3289 Other specified depressive episodes: Secondary | ICD-10-CM

## 2013-12-16 NOTE — Progress Notes (Signed)
   Subjective:    Patient ID: Sophia Costa, female    DOB: Jan 08, 1981, 33 y.o.   MRN: [id number]  HPI Patient seen for six-month followup regarding depression. She's done extremely well on Celexa 20 mg daily. This represents a second time she's had depression issues. She's been exercising more regularly. She had some weight gain over the past few months. She continues to have issues intermittently with rheumatoid arthritis and is followed closely by rheumatology. She is on Remicade and methotrexate and for the most part these are controlling symptoms fairly well.  No sleep disturbance.  Past Medical History  Diagnosis Date  . Allergy   . Chronic kidney disease     microscopic hematuria  . Rheumatoid arthritis(714.0) 7/12   Past Surgical History  Procedure Laterality Date  . Cholecystectomy      reports that she has never smoked. She does not have any smokeless tobacco history on file. She reports that she does not drink alcohol or use illicit drugs. family history includes Arthritis in her mother and paternal grandmother; Diabetes in her paternal grandfather; Heart disease in her paternal grandfather; Hypertension in her mother; Kidney disease in her father and sister. No Known Allergies    Review of Systems  Constitutional: Negative for appetite change and unexpected weight change.  Psychiatric/Behavioral: Negative for suicidal ideas, sleep disturbance, dysphoric mood and agitation. The patient is not nervous/anxious.        Objective:   Physical Exam  Constitutional: She appears well-developed and well-nourished.  Cardiovascular: Normal rate and regular rhythm.   Pulmonary/Chest: Effort normal and breath sounds normal. No respiratory distress. She has no wheezes. She has no rales.  Psychiatric: She has a normal mood and affect. Her behavior is normal. Judgment and thought content normal.          Assessment & Plan:  Maj. depressive episode. This occurred about 6 months  ago. She has done extremely well. Discussed option of discontinuing medication but at this point we elected to wait until she gets through the winter time. She'll consider stopping medication either later this spring or summer.

## 2013-12-16 NOTE — Progress Notes (Signed)
Pre visit review using our clinic review tool, if applicable. No additional management support is needed unless otherwise documented below in the visit note. 

## 2013-12-18 DIAGNOSIS — F339 Major depressive disorder, recurrent, unspecified: Secondary | ICD-10-CM | POA: Insufficient documentation

## 2014-05-16 ENCOUNTER — Other Ambulatory Visit: Payer: Self-pay | Admitting: Dermatology

## 2014-06-16 ENCOUNTER — Other Ambulatory Visit: Payer: Self-pay | Admitting: Family Medicine

## 2014-06-30 ENCOUNTER — Other Ambulatory Visit: Payer: Self-pay | Admitting: Family Medicine

## 2015-01-03 ENCOUNTER — Encounter: Payer: Self-pay | Admitting: Family Medicine

## 2015-01-03 ENCOUNTER — Ambulatory Visit (INDEPENDENT_AMBULATORY_CARE_PROVIDER_SITE_OTHER): Payer: BLUE CROSS/BLUE SHIELD | Admitting: Family Medicine

## 2015-01-03 VITALS — BP 118/80 | HR 89 | Temp 98.3°F | Wt 170.0 lb

## 2015-01-03 DIAGNOSIS — J029 Acute pharyngitis, unspecified: Secondary | ICD-10-CM

## 2015-01-03 DIAGNOSIS — J040 Acute laryngitis: Secondary | ICD-10-CM

## 2015-01-03 LAB — POCT RAPID STREP A (OFFICE): Rapid Strep A Screen: NEGATIVE

## 2015-01-03 NOTE — Patient Instructions (Signed)
Laryngitis °At the top of your windpipe is your voice box. It is the source of your voice. Inside your voice box are 2 bands of muscles called vocal cords. When you breathe, your vocal cords are relaxed and open so that air can get into the lungs. When you decide to say something, these cords come together and vibrate. The sound from these vibrations goes into your throat and comes out through your mouth as sound.  °Laryngitis is an inflammation of the vocal cords that causes hoarseness, cough, loss of voice, sore throat, and dry throat. Laryngitis can be temporary (acute) or long-term (chronic). Most cases of acute laryngitis improve with time.Chronic laryngitis lasts for more than 3 weeks. °CAUSES °Laryngitis can often be related to excessive smoking, talking, or yelling, as well as inhalation of toxic fumes and allergies. Acute laryngitis is usually caused by a viral infection, vocal strain, measles or mumps, or bacterial infections. Chronic laryngitis is usually caused by vocal cord strain, vocal cord injury, postnasal drip, growths on the vocal cords, or acid reflux. °SYMPTOMS  °· Cough. °· Sore throat. °· Dry throat. °RISK FACTORS °· Respiratory infections. °· Exposure to irritating substances, such as cigarette smoke, excessive amounts of alcohol, stomach acids, and workplace chemicals. °· Voice trauma, such as vocal cord injury from shouting or speaking too loud. °DIAGNOSIS  °Your cargiver will perform a physical exam. During the physical exam, your caregiver will examine your throat. The most common sign of laryngitis is hoarseness. Laryngoscopy may be necessary to confirm the diagnosis of this condition. This procedure allows your caregiver to look into the larynx. °HOME CARE INSTRUCTIONS °· Drink enough fluids to keep your urine clear or pale yellow. °· Rest until you no longer have symptoms or as directed by your caregiver. °· Breathe in moist air. °· Take all medicine as directed by your  caregiver. °· Do not smoke. °· Talk as little as possible (this includes whispering). °· Write on paper instead of talking until your voice is back to normal. °· Follow up with your caregiver if your condition has not improved after 10 days. °SEEK MEDICAL CARE IF:  °· You have trouble breathing. °· You cough up blood. °· You have persistent fever. °· You have increasing pain. °· You have difficulty swallowing. °MAKE SURE YOU: °· Understand these instructions. °· Will watch your condition. °· Will get help right away if you are not doing well or get worse. °Document Released: 11/10/2005 Document Revised: 02/02/2012 Document Reviewed: 01/16/2011 °ExitCare® Patient Information ©2015 [employer]. This information is not intended to replace advice given to you by your health care provider. Make sure you discuss any questions you have with your health care provider. ° °

## 2015-01-03 NOTE — Progress Notes (Signed)
   Subjective:    Patient ID: Sophia Costa, female    DOB: 20-Aug-1981, 34 y.o.   MRN: [id number]  HPI Patient seen with 3-4 day history of sore throat, postnasal drainage, dry cough, bilateral ear fullness. She has long history of perennial allergies and already takes Zyrtec and Singulair regularly. Takes Astelin as needed. No history of asthma. Nonsmoker. Her cough is been mostly nonproductive. Using [employer] which helped slightly.  She denies any focal facial pain or upper teeth pain. She's not had any obvious reflux symptoms. She has follow-up to see allergist in March.  She has rheumatoid arthritis and takes Remicade and methotrexate  Past Medical History  Diagnosis Date  . Allergy   . Chronic kidney disease     microscopic hematuria  . Rheumatoid arthritis(714.0) 7/12   Past Surgical History  Procedure Laterality Date  . Cholecystectomy      reports that she has never smoked. She does not have any smokeless tobacco history on file. She reports that she does not drink alcohol or use illicit drugs. family history includes Arthritis in her mother and paternal grandmother; Diabetes in her paternal grandfather; Heart disease in her paternal grandfather; Hypertension in her mother; Kidney disease in her father and sister. No Known Allergies    Review of Systems  Constitutional: Negative for fever and chills.  HENT: Positive for congestion, postnasal drip, sore throat and voice change.   Respiratory: Positive for cough.        Objective:   Physical Exam  Constitutional: She appears well-developed and well-nourished.  HENT:  Right Ear: External ear normal.  Left Ear: External ear normal.  Oropharynx only minimal erythema. No exudate  Neck: Neck supple.  Cardiovascular: Normal rate and regular rhythm.   Pulmonary/Chest: Effort normal and breath sounds normal. No respiratory distress. She has no wheezes. She has no rales.  Lymphadenopathy:    She has no cervical  adenopathy.          Assessment & Plan:  Laryngitis. Differential is viral-most likely, allergic postnasal drip, less likely bacterial. We've recommended symptomatic treatment at this point. Touch base if this is not improving over the next several days

## 2015-01-03 NOTE — Progress Notes (Signed)
Pre visit review using our clinic review tool, if applicable. No additional management support is needed unless otherwise documented below in the visit note. 

## 2015-01-03 NOTE — Addendum Note (Signed)
Addended by: Noe Gens E on: 01/03/2015 01:09 PM   Modules accepted: Orders

## 2015-01-09 ENCOUNTER — Other Ambulatory Visit: Payer: Self-pay | Admitting: Family Medicine

## 2015-05-14 ENCOUNTER — Other Ambulatory Visit: Payer: Self-pay | Admitting: Family Medicine

## 2015-07-11 ENCOUNTER — Other Ambulatory Visit: Payer: Self-pay | Admitting: Family Medicine

## 2015-07-11 ENCOUNTER — Telehealth: Payer: Self-pay | Admitting: Family Medicine

## 2015-07-11 MED ORDER — SCOPOLAMINE 1 MG/3DAYS TD PT72: 1.0000 | MEDICATED_PATCH | TRANSDERMAL | Status: DC

## 2015-07-11 NOTE — Telephone Encounter (Addendum)
Pt is travelling and needs transderm-scop patches send to [facility] [address]

## 2015-07-11 NOTE — Telephone Encounter (Signed)
Rx sent to pharmacy   

## 2015-07-11 NOTE — Telephone Encounter (Signed)
Scopolamine patch-1 patch behind ear every 72 hours as needed for motion sickness prevention   #9

## 2015-07-27 ENCOUNTER — Other Ambulatory Visit: Payer: Self-pay | Admitting: Family Medicine

## 2015-12-28 ENCOUNTER — Other Ambulatory Visit: Payer: Self-pay | Admitting: Family Medicine

## 2016-01-26 ENCOUNTER — Other Ambulatory Visit: Payer: Self-pay | Admitting: Family Medicine

## 2016-01-28 ENCOUNTER — Other Ambulatory Visit: Payer: Self-pay | Admitting: Nephrology

## 2016-01-28 DIAGNOSIS — N189 Chronic kidney disease, unspecified: Secondary | ICD-10-CM

## 2016-02-01 ENCOUNTER — Ambulatory Visit
Admission: RE | Admit: 2016-02-01 | Discharge: 2016-02-01 | Disposition: A | Discharge status: Home / Self Care | Payer: 59 | Source: Ambulatory Visit | Attending: Nephrology | Admitting: Nephrology

## 2016-02-01 DIAGNOSIS — N189 Chronic kidney disease, unspecified: Secondary | ICD-10-CM

## 2016-02-04 ENCOUNTER — Other Ambulatory Visit: Payer: BLUE CROSS/BLUE SHIELD

## 2016-02-27 ENCOUNTER — Other Ambulatory Visit: Payer: Self-pay | Admitting: Family Medicine

## 2016-03-31 ENCOUNTER — Other Ambulatory Visit: Payer: Self-pay | Admitting: Family Medicine

## 2016-04-30 ENCOUNTER — Other Ambulatory Visit: Payer: Self-pay | Admitting: Family Medicine

## 2016-05-01 ENCOUNTER — Other Ambulatory Visit: Payer: Self-pay | Admitting: Family Medicine

## 2016-05-13 ENCOUNTER — Other Ambulatory Visit: Payer: Self-pay | Admitting: Physician Assistant

## 2016-05-13 DIAGNOSIS — M25512 Pain in left shoulder: Secondary | ICD-10-CM

## 2016-05-14 ENCOUNTER — Ambulatory Visit
Admission: RE | Admit: 2016-05-14 | Discharge: 2016-05-14 | Disposition: A | Discharge status: Home / Self Care | Payer: 59 | Source: Ambulatory Visit | Attending: Physician Assistant | Admitting: Physician Assistant

## 2016-05-14 DIAGNOSIS — M25512 Pain in left shoulder: Secondary | ICD-10-CM

## 2016-05-18 ENCOUNTER — Other Ambulatory Visit: Payer: 59

## 2016-06-17 ENCOUNTER — Other Ambulatory Visit: Payer: Self-pay

## 2016-06-17 ENCOUNTER — Other Ambulatory Visit: Payer: Self-pay | Admitting: Emergency Medicine

## 2016-06-17 MED ORDER — MONTELUKAST SODIUM 10 MG PO TABS: 10.0000 mg | ORAL_TABLET | Freq: Every day | ORAL | 0 refills | Status: DC

## 2016-06-17 MED ORDER — CITALOPRAM HYDROBROMIDE 20 MG PO TABS: ORAL_TABLET | ORAL | 0 refills | Status: DC

## 2016-06-30 ENCOUNTER — Ambulatory Visit (INDEPENDENT_AMBULATORY_CARE_PROVIDER_SITE_OTHER): Payer: 59 | Admitting: Family Medicine

## 2016-06-30 VITALS — BP 100/70 | HR 87 | Temp 97.7°F | Ht 64.0 in | Wt 168.9 lb

## 2016-06-30 DIAGNOSIS — Z87898 Personal history of other specified conditions: Secondary | ICD-10-CM | POA: Diagnosis not present

## 2016-06-30 DIAGNOSIS — J309 Allergic rhinitis, unspecified: Secondary | ICD-10-CM

## 2016-06-30 DIAGNOSIS — F329 Major depressive disorder, single episode, unspecified: Secondary | ICD-10-CM

## 2016-06-30 DIAGNOSIS — F32A Depression, unspecified: Secondary | ICD-10-CM

## 2016-06-30 MED ORDER — MONTELUKAST SODIUM 10 MG PO TABS: 10.0000 mg | ORAL_TABLET | Freq: Every day | ORAL | 3 refills | Status: DC

## 2016-06-30 MED ORDER — CITALOPRAM HYDROBROMIDE 20 MG PO TABS: ORAL_TABLET | ORAL | 3 refills | Status: DC

## 2016-06-30 MED ORDER — SCOPOLAMINE 1 MG/3DAYS TD PT72: 1.0000 | MEDICATED_PATCH | TRANSDERMAL | 0 refills | Status: DC

## 2016-06-30 NOTE — Progress Notes (Signed)
Subjective:     Patient ID: Sophia Costa, female   DOB: 1981-09-23, 35 y.o.   MRN: [id number]  HPI Patient seen for medical follow-up. She has history of recurrent depression, rheumatoid arthritis, perennial allergic rhinitis, and familial hematuria. She is followed closely by rheumatology and is on Remicade which seems to working well.  History of recurrent depression with at least 3 major episodes. She is currently doing well on Celexa and requests refills.  Takes Zyrtec and Singulair for allergic rhinitis that seems to working well for her.  Requesting refill of Transderm scopolamine for motion sickness. She had her husband will be traveling to [place] and this has been helpful for her in the past  Past Medical History:  Diagnosis Date  . Allergy   . Chronic kidney disease    microscopic hematuria  . Rheumatoid arthritis(714.0) 7/12   Past Surgical History:  Procedure Laterality Date  . CHOLECYSTECTOMY      reports that she has never smoked. She does not have any smokeless tobacco history on file. She reports that she does not drink alcohol or use drugs. family history includes Arthritis in her mother and paternal grandmother; Diabetes in her paternal grandfather; Heart disease in her paternal grandfather; Hypertension in her mother; Kidney disease in her father and sister. Allergies  Allergen Reactions  . Prednisone     ONLY AT A HIGHER DOSE THAN 10mg .     Review of Systems  Constitutional: Negative for appetite change, fatigue and unexpected weight change.  HENT: Negative for congestion.   Respiratory: Negative for shortness of breath.   Cardiovascular: Negative for chest pain.  Neurological: Negative for headaches.  Psychiatric/Behavioral: Negative for dysphoric mood, sleep disturbance and suicidal ideas.       Objective:   Physical Exam  Constitutional: She appears well-developed and well-nourished.  Neck: Neck supple. No thyromegaly present.   Cardiovascular: Normal rate and regular rhythm.   Pulmonary/Chest: Effort normal and breath sounds normal. No respiratory distress. She has no wheezes. She has no rales.  Musculoskeletal: She exhibits no edema.  Lymphadenopathy:    She has no cervical adenopathy.       Assessment:     #1 history of recurrent major depression currently stable on Celexa  #2 perennial allergic rhinitis stable  #3 history of motion sickness      Plan:     -Refills of scopolamine patch to use as needed -Refill Celexa for one year. Would consider indefinite use, given the fact she's had more than 3 episodes of recurrent depression -Refill Singulair for one year  Eulas Post MD [facility] at [facility]

## 2016-09-22 ENCOUNTER — Ambulatory Visit: Payer: Self-pay | Admitting: Internal Medicine

## 2016-09-22 ENCOUNTER — Encounter: Payer: Self-pay | Admitting: Family Medicine

## 2016-09-22 ENCOUNTER — Telehealth: Payer: Self-pay | Admitting: Family Medicine

## 2016-09-22 ENCOUNTER — Ambulatory Visit (INDEPENDENT_AMBULATORY_CARE_PROVIDER_SITE_OTHER): Payer: 59 | Admitting: Family Medicine

## 2016-09-22 VITALS — BP 110/74 | HR 95 | Temp 97.9°F | Wt 166.8 lb

## 2016-09-22 DIAGNOSIS — R112 Nausea with vomiting, unspecified: Secondary | ICD-10-CM

## 2016-09-22 MED ORDER — ONDANSETRON 4 MG PO TBDP
4.0000 mg | ORAL_TABLET | Freq: Once | ORAL | Status: AC
  Administered 2016-09-22: 4 mg via ORAL

## 2016-09-22 MED ORDER — ONDANSETRON HCL 4 MG/2ML IJ SOLN
4.0000 mg | Freq: Once | INTRAMUSCULAR | Status: AC
  Administered 2016-09-22: 4 mg via INTRAMUSCULAR

## 2016-09-22 MED ORDER — ONDANSETRON 8 MG PO TBDP: 8.0000 mg | ORAL_TABLET | Freq: Three times a day (TID) | ORAL | 0 refills | Status: DC | PRN

## 2016-09-22 NOTE — Telephone Encounter (Signed)
Pt scheduled to see Delano Metz, NP this afternoon. Nothing further needed at this time.

## 2016-09-22 NOTE — Progress Notes (Signed)
Pre visit review using our clinic review tool, if applicable. No additional management support is needed unless otherwise documented below in the visit note. 

## 2016-09-22 NOTE — Patient Instructions (Signed)
Zofran tablets have been provided for nausea. If symptoms do not improve, worsen, or you are unable to keep fluids down through the night, recommend IV fluid hydration.  Follow up is recommended tomorrow or sooner if needed if symptoms persist.  Nausea and Vomiting Nausea is a sick feeling that often comes before throwing up (vomiting). Vomiting is a reflex where stomach contents come out of your mouth. Vomiting can cause severe loss of body fluids (dehydration). Children and elderly adults can become dehydrated quickly, especially if they also have diarrhea. Nausea and vomiting are symptoms of a condition or disease. It is important to find the cause of your symptoms. CAUSES   Direct irritation of the stomach lining. This irritation can result from increased acid production (gastroesophageal reflux disease), infection, food poisoning, taking certain medicines (such as nonsteroidal anti-inflammatory drugs), alcohol use, or tobacco use.  Signals from the brain.These signals could be caused by a headache, heat exposure, an inner ear disturbance, increased pressure in the brain from injury, infection, a tumor, or a concussion, pain, emotional stimulus, or metabolic problems.  An obstruction in the gastrointestinal tract (bowel obstruction).  Illnesses such as diabetes, hepatitis, gallbladder problems, appendicitis, kidney problems, cancer, sepsis, atypical symptoms of a heart attack, or eating disorders.  Medical treatments such as chemotherapy and radiation.  Receiving medicine that makes you sleep (general anesthetic) during surgery. DIAGNOSIS Your caregiver may ask for tests to be done if the problems do not improve after a few days. Tests may also be done if symptoms are severe or if the reason for the nausea and vomiting is not clear. Tests may include:  Urine tests.  Blood tests.  Stool tests.  Cultures (to look for evidence of infection).  X-rays or other imaging studies. Test  results can help your caregiver make decisions about treatment or the need for additional tests. TREATMENT You need to stay well hydrated. Drink frequently but in small amounts.You may wish to drink water, sports drinks, clear broth, or eat frozen ice pops or gelatin dessert to help stay hydrated.When you eat, eating slowly may help prevent nausea.There are also some antinausea medicines that may help prevent nausea. HOME CARE INSTRUCTIONS   Take all medicine as directed by your caregiver.  If you do not have an appetite, do not force yourself to eat. However, you must continue to drink fluids.  If you have an appetite, eat a normal diet unless your caregiver tells you differently.  Eat a variety of complex carbohydrates (rice, wheat, potatoes, bread), lean meats, yogurt, fruits, and vegetables.  Avoid high-fat foods because they are more difficult to digest.  Drink enough water and fluids to keep your urine clear or pale yellow.  If you are dehydrated, ask your caregiver for specific rehydration instructions. Signs of dehydration may include:  Severe thirst.  Dry lips and mouth.  Dizziness.  Dark urine.  Decreasing urine frequency and amount.  Confusion.  Rapid breathing or pulse. SEEK IMMEDIATE MEDICAL CARE IF:   You have blood or brown flecks (like coffee grounds) in your vomit.  You have black or bloody stools.  You have a severe headache or stiff neck.  You are confused.  You have severe abdominal pain.  You have chest pain or trouble breathing.  You do not urinate at least once every 8 hours.  You develop cold or clammy skin.  You continue to vomit for longer than 24 to 48 hours.  You have a fever. MAKE SURE YOU:   Understand  these instructions.  Will watch your condition.  Will get help right away if you are not doing well or get worse.   This information is not intended to replace advice given to you by your health care provider. Make sure you  discuss any questions you have with your health care provider.   Document Released: 11/10/2005 Document Revised: 02/02/2012 Document Reviewed: 04/09/2011 Elsevier Interactive Patient Education [employer].

## 2016-09-22 NOTE — Telephone Encounter (Signed)
[place] [facility]  Patient Name: Sophia Costa  DOB: 02/06/1981    Initial Comment Caller states wife woke up vomiting and watery diarrhea, head full of pressure, she has RA   Nurse Assessment  Nurse: Harlow Mares, RN, Suanne Marker Date/Time Eilene Ghazi Time): 09/22/2016 1:28:47 PM  Confirm and document reason for call. If symptomatic, describe symptoms. You must click the next button to save text entered. ---Caller states wife woke up vomiting and watery diarrhea, head full of pressure, she has RA. Reports vomiting since 3am unable to keep anything down. Watery diarrhea since 5am. Reports feverish. Has been sneezing a lot. Pressure behind her eyes.  Has the patient traveled out of the country within the last 30 days? ---No  Does the patient have any new or worsening symptoms? ---Yes  Will a triage be completed? ---Yes  Related visit to physician within the last 2 weeks? ---No  Does the PT have any chronic conditions? (i.e. diabetes, asthma, etc.) ---Yes  List chronic conditions. ---RA (gets methotrexate injections and remacade infusions q 6 weeks).  Is the patient pregnant or possibly pregnant? (Ask all females between the ages of 78-55) ---No  Is this a behavioral health or substance abuse call? ---No     Guidelines    Guideline Title Affirmed Question Affirmed Notes  Vomiting [1] SEVERE vomiting (e.g., 6 or more times/day) AND [2] present > 8 hours    Final Disposition User   Go to ED Now (or PCP triage) Harlow Mares, RN, Suanne Marker    Comments  Appt scheduled for today at the [facility] location with Dr. Shanon Ace at 3pm. (Dr. Elease Hashimoto had no openins today). Caller reports that he doesn't want caller going to ED unless she has to.   Referrals  REFERRED TO PCP OFFICE   Disagree/Comply: Comply

## 2016-09-22 NOTE — Progress Notes (Signed)
Subjective:    Patient ID: Sophia Costa, female    DOB: Aug 16, 1981, 35 y.o.   MRN: [id number]  HPI  [name] is a 35 year old female presents today with nausea with vomiting and diarrhea since this morning.  Associated symptom of fever at home but does not know Tmax, chills, sweats, and mild headache are present. She denies chest pain, palpitations, SOB, dizziness, visual disturbances, sore throat, stiff neck, or bloody stool. Recent sick contact exposure from her husband with a recent sinus infection and coworker had a GI virus.   No new medications. She has weekly methotrexate injections weekly and remicade every 6 weeks and she is due in 2 weeks. She has been receiving these medications for approximately 5 years without adverse effects. She denies pregnancy and has an IUD currently. Influenza vaccine 2 weeks ago. No recent travel, antibiotic therapy, or suspicious foods noted. No treatments have been tried at home. Diarrhea has ceased with last episode this morning. Nausea remains present and she notes "dry heaving" as she has not been drinking or eating. No reliving factors are noted.  Review of Systems  Constitutional: Positive for chills and fever.  Eyes: Negative for visual disturbance.  Respiratory: Negative for cough, shortness of breath and wheezing.   Cardiovascular: Negative for chest pain and palpitations.  Gastrointestinal: Positive for diarrhea, nausea and vomiting. Negative for abdominal distention and blood in stool.  Genitourinary: Negative for dysuria, flank pain, frequency, hematuria and urgency.  Musculoskeletal: Negative for myalgias.  Neurological: Positive for headaches. Negative for dizziness, weakness, light-headedness and numbness.   Past Medical History:  Diagnosis Date  . Allergy   . Chronic kidney disease    microscopic hematuria  . Rheumatoid arthritis(714.0) 7/12     Social History   Social History  . Marital status: Married    Spouse name:  N/A  . Number of children: N/A  . Years of education: N/A   Occupational History  . Not on file.   Social History Main Topics  . Smoking status: Never Smoker  . Smokeless tobacco: Not on file  . Alcohol use No  . Drug use: No  . Sexual activity: Not on file   Other Topics Concern  . Not on file   Social History Narrative  . No narrative on file    Past Surgical History:  Procedure Laterality Date  . CHOLECYSTECTOMY      Family History  Problem Relation Age of Onset  . Arthritis Mother   . Hypertension Mother   . Kidney disease Father   . Kidney disease Sister     IgA nephropathy  . Arthritis Paternal Grandmother   . Heart disease Paternal Grandfather   . Diabetes Paternal Grandfather     Allergies  Allergen Reactions  . Prednisone     ONLY AT A HIGHER DOSE THAN 10mg .    Current Outpatient Prescriptions on File Prior to Visit  Medication Sig Dispense Refill  . ACZONE 5 % topical gel Once daily.    Marland Kitchen azelastine (ASTELIN) 137 MCG/SPRAY nasal spray Place 2 sprays into the nose 2 (two) times daily. Use in each nostril as directed 30 mL 12  . calcium citrate-vitamin D (CITRACAL+D) 315-200 MG-UNIT per tablet Take 2 tablets by mouth daily.    . cetirizine (ZYRTEC) 10 MG tablet Take 10 mg by mouth daily.    . Cholecalciferol (VITAMIN D3) 1000 UNITS CAPS Take 1 capsule by mouth daily.    . citalopram (CELEXA) 20 MG  tablet TAKE 1 TABLET (20 MG TOTAL) BY MOUTH DAILY. 90 tablet 3  . fish oil-omega-3 fatty acids 1000 MG capsule Take 2 g by mouth daily.    . folic acid (FOLVITE) 1 MG tablet Take 1 tablet by mouth daily.    Marland Kitchen glucosamine-chondroitin 500-400 MG tablet Take 1 tablet by mouth 3 (three) times daily.    . InFLIXimab (REMICADE IV) Inject into the vein. Injection every 6 weeks.    . methotrexate (RHEUMATREX) 2.5 MG tablet Take 8 tablets by mouth Once a week.     . montelukast (SINGULAIR) 10 MG tablet Take 1 tablet (10 mg total) by mouth at bedtime. 90 tablet 3  .  scopolamine (TRANSDERM-SCOP) 1 MG/3DAYS Place 1 patch (1.5 mg total) onto the skin every 3 (three) days. 10 patch 0  . sodium chloride 0.9 % SOLN with inFLIXimab 100 MG SOLR Inject 5 mg/kg into the vein every 8 (eight) weeks.     No current facility-administered medications on file prior to visit.     BP 110/74 (BP Location: Left Arm, Patient Position: Sitting, Cuff Size: Normal)   Pulse 95   Temp 97.9 F (36.6 C) (Oral)   Wt 166 lb 12.8 oz (75.7 kg)   SpO2 95%   BMI 28.63 kg/m       Objective:   Physical Exam  Constitutional: She is oriented to person, place, and time. She appears well-developed and well-nourished.  HENT:  Mouth/Throat: Mucous membranes are normal. No oropharyngeal exudate or posterior oropharyngeal erythema.  Eyes: Pupils are equal, round, and reactive to light. No scleral icterus.  Cardiovascular: Normal rate and regular rhythm.   Pulmonary/Chest: Effort normal and breath sounds normal. She has no wheezes. She has no rales.  Abdominal: Soft. Bowel sounds are normal. There is no tenderness.  Neurological: She is alert and oriented to person, place, and time. She has normal strength. Coordination and gait normal.  Skin: Skin is warm and dry. No rash noted.      Assessment & Plan:  1. Nausea and vomiting, intractability of vomiting not specified, unspecified vomiting type Suspect nausea and vomiting is from a viral infection. Provided ondansetron 4 mg IM in office and sublingual ondansetron 8 mg for use at home. BMP will be obtained. Advised patient and her husband regarding the need for IV fluid hydration if ondansetron does not control symptoms so patient can begin drinking small amounts of fluids. Signs of dehydration discussed such as severe thirst, dizziness, decreasing urinary frequency and amount or increase in heart rate. Patient and husband verbalized understanding and will monitor patient throughout this evening for signs of dehydration.  Close monitoring is  recommended and they have agreed to seek care in the ED for IV fluids if needed.   - ondansetron (ZOFRAN ODT) 8 MG disintegrating tablet; Take 1 tablet (8 mg total) by mouth every 8 (eight) hours as needed for nausea or vomiting.  Dispense: 20 tablet; Refill: 0 -- Basic metabolic panel  Return precautions advised.  Delano Metz, FNP-C

## 2016-09-23 LAB — BASIC METABOLIC PANEL
BUN: 15 mg/dL (ref 6–23)
CO2: 29 mEq/L (ref 19–32)
Calcium: 10.4 mg/dL (ref 8.4–10.5)
Chloride: 99 mEq/L (ref 96–112)
Creatinine, Ser: 0.82 mg/dL (ref 0.40–1.20)
GFR: 84.18 mL/min (ref >60.00)
Glucose, Bld: 80 mg/dL (ref 70–99)
Potassium: 4.2 mEq/L (ref 3.5–5.1)
Sodium: 139 mEq/L (ref 135–145)

## 2016-10-02 ENCOUNTER — Ambulatory Visit: Payer: Self-pay | Admitting: Family Medicine

## 2016-12-22 DIAGNOSIS — M0589 Other rheumatoid arthritis with rheumatoid factor of multiple sites: Secondary | ICD-10-CM | POA: Diagnosis not present

## 2017-01-19 DIAGNOSIS — M0579 Rheumatoid arthritis with rheumatoid factor of multiple sites without organ or systems involvement: Secondary | ICD-10-CM | POA: Diagnosis not present

## 2017-01-19 DIAGNOSIS — R319 Hematuria, unspecified: Secondary | ICD-10-CM | POA: Diagnosis not present

## 2017-02-02 DIAGNOSIS — M0589 Other rheumatoid arthritis with rheumatoid factor of multiple sites: Secondary | ICD-10-CM | POA: Diagnosis not present

## 2017-02-23 DIAGNOSIS — R319 Hematuria, unspecified: Secondary | ICD-10-CM | POA: Diagnosis not present

## 2017-02-23 DIAGNOSIS — M0579 Rheumatoid arthritis with rheumatoid factor of multiple sites without organ or systems involvement: Secondary | ICD-10-CM | POA: Diagnosis not present

## 2017-04-13 DIAGNOSIS — M0579 Rheumatoid arthritis with rheumatoid factor of multiple sites without organ or systems involvement: Secondary | ICD-10-CM | POA: Diagnosis not present

## 2017-04-13 DIAGNOSIS — R319 Hematuria, unspecified: Secondary | ICD-10-CM | POA: Diagnosis not present

## 2017-04-17 ENCOUNTER — Encounter: Payer: Self-pay | Admitting: Family Medicine

## 2017-06-12 DIAGNOSIS — Z13220 Encounter for screening for lipoid disorders: Secondary | ICD-10-CM | POA: Diagnosis not present

## 2017-06-12 DIAGNOSIS — R319 Hematuria, unspecified: Secondary | ICD-10-CM | POA: Diagnosis not present

## 2017-06-12 DIAGNOSIS — M0579 Rheumatoid arthritis with rheumatoid factor of multiple sites without organ or systems involvement: Secondary | ICD-10-CM | POA: Diagnosis not present

## 2017-06-12 LAB — HEPATIC FUNCTION PANEL
ALT: 19 (ref 7–35)
AST: 23 (ref 13–35)
Alkaline Phosphatase: 53 (ref 25–125)
Bilirubin, Total: 0.6

## 2017-06-12 LAB — BASIC METABOLIC PANEL
BUN: 16 (ref 4–21)
Creatinine: 0.9 (ref 0.5–1.1)
Glucose: 94
Potassium: 4.6 (ref 3.4–5.3)
Sodium: 140 (ref 137–147)

## 2017-06-12 LAB — LIPID PANEL
Cholesterol: 215 — AB (ref 0–200)
HDL: 108 — AB (ref 35–70)
LDL Cholesterol: 92
Triglycerides: 77 (ref 40–160)

## 2017-06-12 LAB — CBC AND DIFFERENTIAL
HCT: 43 (ref 36–46)
Hemoglobin: 14.5 (ref 12.0–16.0)
Platelets: 248 (ref 150–399)
WBC: 5.6

## 2017-06-22 ENCOUNTER — Encounter: Payer: Self-pay | Admitting: Family Medicine

## 2017-06-22 DIAGNOSIS — M069 Rheumatoid arthritis, unspecified: Secondary | ICD-10-CM | POA: Diagnosis not present

## 2017-06-22 DIAGNOSIS — R319 Hematuria, unspecified: Secondary | ICD-10-CM | POA: Diagnosis not present

## 2017-06-22 DIAGNOSIS — N182 Chronic kidney disease, stage 2 (mild): Secondary | ICD-10-CM | POA: Diagnosis not present

## 2017-07-28 ENCOUNTER — Other Ambulatory Visit: Payer: Self-pay | Admitting: Family Medicine

## 2017-08-13 ENCOUNTER — Telehealth: Payer: Self-pay | Admitting: Family Medicine

## 2017-08-13 NOTE — Telephone Encounter (Signed)
° ° °  Pt call to ask if a motion sick patch can be called in for her    scopolamine patch   Pharmacy  [facility] [place]

## 2017-08-15 NOTE — Telephone Encounter (Signed)
OK to send in Scopolamine :  One patch behind ear every 72 hours prn to prevent motion sickness. #5- unless she needs more.

## 2017-08-17 MED ORDER — SCOPOLAMINE 1 MG/3DAYS TD PT72: MEDICATED_PATCH | TRANSDERMAL | 0 refills | Status: DC

## 2017-08-17 NOTE — Telephone Encounter (Signed)
Medication filled to pharmacy as requested.   

## 2017-08-31 DIAGNOSIS — M0579 Rheumatoid arthritis with rheumatoid factor of multiple sites without organ or systems involvement: Secondary | ICD-10-CM | POA: Diagnosis not present

## 2017-08-31 DIAGNOSIS — R5382 Chronic fatigue, unspecified: Secondary | ICD-10-CM | POA: Diagnosis not present

## 2017-08-31 DIAGNOSIS — R319 Hematuria, unspecified: Secondary | ICD-10-CM | POA: Diagnosis not present

## 2017-09-22 DIAGNOSIS — Z01419 Encounter for gynecological examination (general) (routine) without abnormal findings: Secondary | ICD-10-CM | POA: Diagnosis not present

## 2017-10-19 DIAGNOSIS — Z23 Encounter for immunization: Secondary | ICD-10-CM | POA: Diagnosis not present

## 2017-10-19 DIAGNOSIS — R319 Hematuria, unspecified: Secondary | ICD-10-CM | POA: Diagnosis not present

## 2017-10-19 DIAGNOSIS — M0579 Rheumatoid arthritis with rheumatoid factor of multiple sites without organ or systems involvement: Secondary | ICD-10-CM | POA: Diagnosis not present

## 2017-10-27 ENCOUNTER — Other Ambulatory Visit: Payer: Self-pay | Admitting: Family Medicine

## 2017-11-30 ENCOUNTER — Other Ambulatory Visit: Payer: Self-pay | Admitting: Family Medicine

## 2017-11-30 DIAGNOSIS — D2261 Melanocytic nevi of right upper limb, including shoulder: Secondary | ICD-10-CM | POA: Diagnosis not present

## 2017-11-30 DIAGNOSIS — D225 Melanocytic nevi of trunk: Secondary | ICD-10-CM | POA: Diagnosis not present

## 2017-11-30 DIAGNOSIS — D1801 Hemangioma of skin and subcutaneous tissue: Secondary | ICD-10-CM | POA: Diagnosis not present

## 2017-11-30 DIAGNOSIS — L918 Other hypertrophic disorders of the skin: Secondary | ICD-10-CM | POA: Diagnosis not present

## 2017-12-07 ENCOUNTER — Ambulatory Visit: Payer: 59 | Admitting: Family Medicine

## 2017-12-07 ENCOUNTER — Encounter: Payer: Self-pay | Admitting: Family Medicine

## 2017-12-07 VITALS — BP 104/78 | Temp 98.2°F | Wt 182.0 lb

## 2017-12-07 DIAGNOSIS — T753XXD Motion sickness, subsequent encounter: Secondary | ICD-10-CM

## 2017-12-07 DIAGNOSIS — F339 Major depressive disorder, recurrent, unspecified: Secondary | ICD-10-CM

## 2017-12-07 DIAGNOSIS — R319 Hematuria, unspecified: Secondary | ICD-10-CM | POA: Diagnosis not present

## 2017-12-07 DIAGNOSIS — T753XXA Motion sickness, initial encounter: Secondary | ICD-10-CM | POA: Insufficient documentation

## 2017-12-07 DIAGNOSIS — N182 Chronic kidney disease, stage 2 (mild): Secondary | ICD-10-CM | POA: Diagnosis not present

## 2017-12-07 MED ORDER — SCOPOLAMINE 1 MG/3DAYS TD PT72: MEDICATED_PATCH | TRANSDERMAL | 1 refills | Status: DC

## 2017-12-07 MED ORDER — CITALOPRAM HYDROBROMIDE 20 MG PO TABS: 20.0000 mg | ORAL_TABLET | Freq: Every day | ORAL | 3 refills | Status: DC

## 2017-12-07 NOTE — Progress Notes (Signed)
Subjective:     Patient ID: Sophia Costa, female   DOB: 02-09-1981, 37 y.o.   MRN: [id number]  HPI Patient seen to discuss the following  Long history of recurrent depression. She said she's had at least 3 episodes during her lifetime depression. She's been stable on Celexa 20 mg daily for several years. Denies any side effects. Compliant with therapy. She forgot her dosage one time on a weekend and experienced some dizziness when she went cold off of this. Depression symptoms currently stable  Rheumatoid arthritis followed by rheumatology and recently started on Humira. Arthritis symptoms relatively stable  Patient has history of motion sickness. She has upcoming trip in February that will require lots of driving around curvy roads. She has done well with scopolamine in the past. Requesting refills. No contraindications.  Past Medical History:  Diagnosis Date  . Allergy   . Chronic kidney disease    microscopic hematuria  . Rheumatoid arthritis(714.0) 7/12   Past Surgical History:  Procedure Laterality Date  . CHOLECYSTECTOMY      reports that  has never smoked. she has never used smokeless tobacco. She reports that she does not drink alcohol or use drugs. family history includes Arthritis in her mother and paternal grandmother; Diabetes in her paternal grandfather; Heart disease in her paternal grandfather; Hypertension in her mother; Kidney disease in her father and sister. Allergies  Allergen Reactions  . Prednisone     ONLY AT A HIGHER DOSE THAN 10mg .     Review of Systems  Constitutional: Negative for fatigue.  Eyes: Negative for visual disturbance.  Respiratory: Negative for cough, chest tightness, shortness of breath and wheezing.   Cardiovascular: Negative for chest pain, palpitations and leg swelling.  Neurological: Negative for dizziness, seizures, syncope, weakness, light-headedness and headaches.  Psychiatric/Behavioral: Negative for agitation, dysphoric mood and  suicidal ideas.       Objective:   Physical Exam  Constitutional: She appears well-developed and well-nourished.  Neck: Neck supple.  Cardiovascular: Normal rate and regular rhythm. Exam reveals no gallop.  Pulmonary/Chest: Effort normal and breath sounds normal. No respiratory distress. She has no wheezes. She has no rales.  Psychiatric: She has a normal mood and affect. Her behavior is normal.       Assessment:     #1 history of recurrent depression currently stable on Celexa. She's had by report at least 3 separate episodes of major depression.   #2 motion sickness history  #3 history of rheumatoid arthritis    Plan:     -Refill Celexa for one year -She does use tramadol but fairly infrequently- about once per month but is cautioned about concomitant use with SSRIs -Refill scopolamine for as needed use  Eulas Post MD [facility]

## 2017-12-14 DIAGNOSIS — M0579 Rheumatoid arthritis with rheumatoid factor of multiple sites without organ or systems involvement: Secondary | ICD-10-CM | POA: Diagnosis not present

## 2017-12-14 DIAGNOSIS — R319 Hematuria, unspecified: Secondary | ICD-10-CM | POA: Diagnosis not present

## 2017-12-14 DIAGNOSIS — Z23 Encounter for immunization: Secondary | ICD-10-CM | POA: Diagnosis not present

## 2018-02-15 DIAGNOSIS — R197 Diarrhea, unspecified: Secondary | ICD-10-CM | POA: Diagnosis not present

## 2018-03-01 DIAGNOSIS — M0579 Rheumatoid arthritis with rheumatoid factor of multiple sites without organ or systems involvement: Secondary | ICD-10-CM | POA: Diagnosis not present

## 2018-03-01 DIAGNOSIS — R5382 Chronic fatigue, unspecified: Secondary | ICD-10-CM | POA: Diagnosis not present

## 2018-03-01 DIAGNOSIS — R319 Hematuria, unspecified: Secondary | ICD-10-CM | POA: Diagnosis not present

## 2018-03-19 DIAGNOSIS — K625 Hemorrhage of anus and rectum: Secondary | ICD-10-CM | POA: Diagnosis not present

## 2018-03-19 DIAGNOSIS — R197 Diarrhea, unspecified: Secondary | ICD-10-CM | POA: Diagnosis not present

## 2018-03-19 LAB — HM COLONOSCOPY

## 2018-03-26 ENCOUNTER — Encounter: Payer: Self-pay | Admitting: Family Medicine

## 2018-04-12 DIAGNOSIS — K580 Irritable bowel syndrome with diarrhea: Secondary | ICD-10-CM | POA: Diagnosis not present

## 2018-05-20 ENCOUNTER — Observation Stay (HOSPITAL_COMMUNITY)
Admission: EM | Admit: 2018-05-20 | Discharge: 2018-05-21 | Disposition: A | Discharge status: Home / Self Care | Payer: 59 | Attending: Emergency Medicine | Admitting: Emergency Medicine

## 2018-05-20 ENCOUNTER — Other Ambulatory Visit: Payer: Self-pay | Admitting: Internal Medicine

## 2018-05-20 ENCOUNTER — Other Ambulatory Visit: Payer: Self-pay

## 2018-05-20 ENCOUNTER — Ambulatory Visit
Admission: RE | Admit: 2018-05-20 | Discharge: 2018-05-20 | Disposition: A | Discharge status: Home / Self Care | Payer: 59 | Source: Ambulatory Visit | Attending: Internal Medicine | Admitting: Internal Medicine

## 2018-05-20 ENCOUNTER — Encounter (HOSPITAL_COMMUNITY): Payer: Self-pay | Admitting: Emergency Medicine

## 2018-05-20 ENCOUNTER — Emergency Department (HOSPITAL_COMMUNITY): Payer: 59

## 2018-05-20 DIAGNOSIS — M542 Cervicalgia: Secondary | ICD-10-CM | POA: Diagnosis present

## 2018-05-20 DIAGNOSIS — I7774 Dissection of vertebral artery: Principal | ICD-10-CM | POA: Insufficient documentation

## 2018-05-20 DIAGNOSIS — M0579 Rheumatoid arthritis with rheumatoid factor of multiple sites without organ or systems involvement: Secondary | ICD-10-CM

## 2018-05-20 DIAGNOSIS — N189 Chronic kidney disease, unspecified: Secondary | ICD-10-CM | POA: Diagnosis not present

## 2018-05-20 DIAGNOSIS — Z79899 Other long term (current) drug therapy: Secondary | ICD-10-CM | POA: Insufficient documentation

## 2018-05-20 DIAGNOSIS — F339 Major depressive disorder, recurrent, unspecified: Secondary | ICD-10-CM | POA: Diagnosis present

## 2018-05-20 DIAGNOSIS — M069 Rheumatoid arthritis, unspecified: Secondary | ICD-10-CM | POA: Diagnosis not present

## 2018-05-20 DIAGNOSIS — R51 Headache: Secondary | ICD-10-CM | POA: Insufficient documentation

## 2018-05-20 DIAGNOSIS — M62838 Other muscle spasm: Secondary | ICD-10-CM | POA: Diagnosis not present

## 2018-05-20 DIAGNOSIS — F333 Major depressive disorder, recurrent, severe with psychotic symptoms: Secondary | ICD-10-CM | POA: Insufficient documentation

## 2018-05-20 LAB — I-STAT CHEM 8, ED
BUN: 16 mg/dL (ref 6–20)
Calcium, Ion: 1.17 mmol/L (ref 1.15–1.40)
Chloride: 104 mmol/L (ref 98–111)
Creatinine, Ser: 0.9 mg/dL (ref 0.44–1.00)
Glucose, Bld: 125 mg/dL — ABNORMAL HIGH (ref 70–99)
HCT: 45 % (ref 36.0–46.0)
Hemoglobin: 15.3 g/dL — ABNORMAL HIGH (ref 12.0–15.0)
Potassium: 3.6 mmol/L (ref 3.5–5.1)
Sodium: 139 mmol/L (ref 135–145)
TCO2: 23 mmol/L (ref 22–32)

## 2018-05-20 LAB — CBC WITH DIFFERENTIAL/PLATELET
Abs Immature Granulocytes: 0 10*3/uL (ref 0.0–0.1)
Basophils Absolute: 0.1 10*3/uL (ref 0.0–0.1)
Basophils Relative: 1 %
Eosinophils Absolute: 0.5 10*3/uL (ref 0.0–0.7)
Eosinophils Relative: 5 %
HCT: 40.3 % (ref 36.0–46.0)
Hemoglobin: 13.7 g/dL (ref 12.0–15.0)
Immature Granulocytes: 0 %
Lymphocytes Relative: 36 %
Lymphs Abs: 3.2 10*3/uL (ref 0.7–4.0)
MCH: 31.4 pg (ref 26.0–34.0)
MCHC: 34 g/dL (ref 30.0–36.0)
MCV: 92.2 fL (ref 78.0–100.0)
Monocytes Absolute: 1.1 10*3/uL — ABNORMAL HIGH (ref 0.1–1.0)
Monocytes Relative: 12 %
Neutro Abs: 4.1 10*3/uL (ref 1.7–7.7)
Neutrophils Relative %: 46 %
Platelets: 193 10*3/uL (ref 150–400)
RBC: 4.37 MIL/uL (ref 3.87–5.11)
RDW: 13.5 % (ref 11.5–15.5)
WBC: 9 10*3/uL (ref 4.0–10.5)

## 2018-05-20 LAB — BASIC METABOLIC PANEL
Anion gap: 8 (ref 5–15)
BUN: 14 mg/dL (ref 6–20)
CO2: 24 mmol/L (ref 22–32)
Calcium: 9.2 mg/dL (ref 8.9–10.3)
Chloride: 106 mmol/L (ref 98–111)
Creatinine, Ser: 0.94 mg/dL (ref 0.44–1.00)
GFR calc Af Amer: >60 mL/min (ref >60)
GFR calc non Af Amer: >60 mL/min (ref >60)
Glucose, Bld: 112 mg/dL — ABNORMAL HIGH (ref 70–99)
Potassium: 3.7 mmol/L (ref 3.5–5.1)
Sodium: 138 mmol/L (ref 135–145)

## 2018-05-20 LAB — I-STAT BETA HCG BLOOD, ED (MC, WL, AP ONLY): I-stat hCG, quantitative: <5 m[IU]/mL (ref <5)

## 2018-05-20 MED ORDER — IOPAMIDOL (ISOVUE-370) INJECTION 76%
INTRAVENOUS | Status: AC
  Filled 2018-05-20: qty 50

## 2018-05-20 MED ORDER — ASPIRIN EC 325 MG PO TBEC
325.0000 mg | DELAYED_RELEASE_TABLET | Freq: Once | ORAL | Status: AC
  Administered 2018-05-20: 325 mg via ORAL
  Filled 2018-05-20: qty 1

## 2018-05-20 MED ORDER — IOPAMIDOL (ISOVUE-370) INJECTION 76%
50.0000 mL | Freq: Once | INTRAVENOUS | Status: AC | PRN
  Administered 2018-05-20: 50 mL via INTRAVENOUS

## 2018-05-20 MED ORDER — ASPIRIN 81 MG PO CHEW: 324.0000 mg | CHEWABLE_TABLET | Freq: Once | ORAL | Status: DC

## 2018-05-20 NOTE — ED Provider Notes (Addendum)
[facility] EMERGENCY DEPARTMENT Provider Note   CSN: [id number] Arrival date & time: 05/20/18  1959     History   Chief Complaint Chief Complaint  Patient presents with  . Neck Pain  . MRI    HPI Sophia Costa is a 37 y.o. female the past medical history of rheumatoid arthritis who presents emergency department today for abnormal MRI.  Patient reports that approximately 8/9 days ago she had a viral GI bug and had several episodes of emesis.  She reports the next day when she woke up she had pain on the right side of her neck, with constant throbbing in nature.  She reports that the pain worsens as the days and on and she developed numbness and tingling that radiated down her right shoulder, posterior elbow and into her second through fifth fingertips. This has now resolved. She notes that she has a accompanying headache on the posterior occiput she describes as dull.  She notes that she is been taking tramadol for symptoms it is helped relieved her pain to a 6/10.  She reports she went to see her rheumatologist today who did an x-ray that was negative. She went for MRI of the cervical spine and it showed an irregular flow void within the right vertebral artery that was concerning for a vascular injury/dissection.  Patient presented here today and was seen in quick look with labs and imaging ordered. Patient denies any visual changes, facial droop, difficulty with speech, changes in hearing, tinnitus, focal weakness.  Patient denies any trauma.  She denies any cervical ambulation.   HPI  Past Medical History:  Diagnosis Date  . Allergy   . Chronic kidney disease    microscopic hematuria  . Rheumatoid arthritis(714.0) 7/12    Patient Active Problem List   Diagnosis Date Noted  . Motion sickness 12/07/2017  . Depression, recurrent ([place]) 12/18/2013  . Flu-like symptoms 11/25/2013  . Allergic rhinitis 02/10/2013  . Rheumatoid arthritis(714.0) 07/02/2012  .  Hematuria 07/02/2012    Past Surgical History:  Procedure Laterality Date  . CHOLECYSTECTOMY       OB History   None      Home Medications    Prior to Admission medications   Medication Sig Start Date End Date Taking? Authorizing Provider  ACZONE 5 % topical gel Once daily. 06/29/12   [provider]  Adalimumab (HUMIRA) 10 MG/0.1ML PSKT Inject into the skin.    [provider]  azelastine (ASTELIN) 137 MCG/SPRAY nasal spray Place 2 sprays into the nose 2 (two) times daily. Use in each nostril as directed 06/10/13   Burchette, Alinda Sierras, MD  calcium citrate-vitamin D (CITRACAL+D) 315-200 MG-UNIT per tablet Take 2 tablets by mouth daily.    [provider]  cetirizine (ZYRTEC) 10 MG tablet Take 10 mg by mouth daily.    [provider]  Cholecalciferol (VITAMIN D3) 1000 UNITS CAPS Take 1 capsule by mouth daily.    [provider]  citalopram (CELEXA) 20 MG tablet Take 1 tablet (20 mg total) by mouth daily. 12/07/17   Burchette, Alinda Sierras, MD  colestipol (COLESTID) 1 g tablet Take 2 g by mouth daily. 05/08/18   [provider]  fish oil-omega-3 fatty acids 1000 MG capsule Take 2 g by mouth daily.    [provider]  folic acid (FOLVITE) 1 MG tablet Take 1 tablet by mouth 2 (two) times daily.  05/30/12   [provider]  glucosamine-chondroitin 500-400 MG tablet  Take 1 tablet by mouth 3 (three) times daily.    [provider]  methotrexate 50 MG/2ML injection Inject into the vein. Inject 25 mg/ml weekly    [provider]  ondansetron (ZOFRAN ODT) 8 MG disintegrating tablet Take 1 tablet (8 mg total) by mouth every 8 (eight) hours as needed for nausea or vomiting. 09/22/16   Delano Metz, FNP  scopolamine (TRANSDERM-SCOP) 1 MG/3DAYS 1 patch behind ear every 72 hours PRN to prevent motion sickness. 12/07/17   Burchette, Alinda Sierras, MD  sodium chloride 0.9 % SOLN with inFLIXimab 100 MG SOLR Inject 5 mg/kg into  the vein every 8 (eight) weeks.    [provider]    Family History Family History  Problem Relation Age of Onset  . Arthritis Mother   . Hypertension Mother   . Kidney disease Father   . Kidney disease Sister        IgA nephropathy  . Arthritis Paternal Grandmother   . Heart disease Paternal Grandfather   . Diabetes Paternal Grandfather     Social History Social History   Tobacco Use  . Smoking status: Never Smoker  . Smokeless tobacco: Never Used  Substance Use Topics  . Alcohol use: Yes    Comment: occ  . Drug use: No     Allergies   Prednisone   Review of Systems Review of Systems  All other systems reviewed and are negative.    Physical Exam Updated Vital Signs BP 124/81 (BP Location: Right Arm)   Pulse (!) 107   Temp 98.5 F (36.9 C) (Oral)   Resp 20   Ht 5\' 4"  (1.626 m)   Wt 81.6 kg (180 lb)   SpO2 97%   BMI 30.90 kg/m   Physical Exam  Constitutional: She appears well-developed and well-nourished.  HENT:  Head: Normocephalic and atraumatic.  Right Ear: External ear normal.  Left Ear: External ear normal.  Nose: Nose normal.  Mouth/Throat: Uvula is midline, oropharynx is clear and moist and mucous membranes are normal. No tonsillar exudate.  Eyes: Pupils are equal, round, and reactive to light. Right eye exhibits no discharge. Left eye exhibits no discharge. No scleral icterus.  Neck: Trachea normal. Neck supple. Muscular tenderness present.  Patient is neck in a rigid position.  She is resistant to rom  Cardiovascular: Normal rate, regular rhythm and intact distal pulses.  No murmur heard. Pulses:      Radial pulses are 2+ on the right side, and 2+ on the left side.       Dorsalis pedis pulses are 2+ on the right side, and 2+ on the left side.       Posterior tibial pulses are 2+ on the right side, and 2+ on the left side.  No lower extremity swelling or edema. Calves symmetric in size bilaterally.  Pulmonary/Chest: Effort normal  and breath sounds normal. She exhibits no tenderness.  Abdominal: Soft. Bowel sounds are normal. There is no tenderness. There is no rebound and no guarding.  Musculoskeletal: She exhibits no edema.  Lymphadenopathy:    She has no cervical adenopathy.  Neurological: She is alert.  Mental Status:  Alert, oriented, thought content appropriate, able to give a coherent history. Speech fluent without evidence of aphasia. Able to follow 2 step commands without difficulty.  Cranial Nerves:  II:  Peripheral visual fields grossly normal, pupils equal, round, reactive to light III,IV, VI: ptosis not present, extra-ocular motions intact bilaterally  V,VII: smile symmetric, eyebrows raise symmetric,  facial light touch sensation equal VIII: hearing grossly normal to voice  X: uvula elevates symmetrically  XI: bilateral shoulder shrug symmetric and strong XII: midline tongue extension without fassiculations Motor:  Normal tone. 4/5 strength in the right upper extremity. 5/5 in the left upper extremity.  5/5 lower extremity strength bilaterally. Sensory: Sensation intact to light touch in all extremities. Negative Romberg.  Deep Tendon Reflexes: 2+ and symmetric in the biceps and patella Cerebellar: normal finger-to-nose with bilateral upper extremities.   CV: distal pulses palpable throughout   Skin: Skin is warm and dry. No rash noted. She is not diaphoretic.  Psychiatric: She has a normal mood and affect.  Nursing note and vitals reviewed.    ED Treatments / Results  Labs (all labs ordered are listed, but only abnormal results are displayed) Labs Reviewed  BASIC METABOLIC PANEL - Abnormal; Notable for the following components:      Result Value   Glucose, Bld 112 (*)    All other components within normal limits  CBC WITH DIFFERENTIAL/PLATELET - Abnormal; Notable for the following components:   Monocytes Absolute 1.1 (*)    All other components within normal limits  I-STAT CHEM 8, ED -  Abnormal; Notable for the following components:   Glucose, Bld 125 (*)    Hemoglobin 15.3 (*)    All other components within normal limits  I-STAT BETA HCG BLOOD, ED ([facility], [facility], [facility] ONLY)    EKG None  Radiology Ct Angio Head W Or Wo Contrast  Result Date: 05/20/2018 CLINICAL DATA:  Initial evaluation for possible right vertebral artery abnormality. EXAM: CT ANGIOGRAPHY HEAD AND NECK TECHNIQUE: Multidetector CT imaging of the head and neck was performed using the standard protocol during bolus administration of intravenous contrast. Multiplanar CT image reconstructions and MIPs were obtained to evaluate the vascular anatomy. Carotid stenosis measurements (when applicable) are obtained utilizing NASCET criteria, using the distal internal carotid diameter as the denominator. CONTRAST:  25mL ISOVUE-370 IOPAMIDOL (ISOVUE-370) INJECTION 76% COMPARISON:  Prior MRI of the cervical spine from earlier the same day. FINDINGS: CT HEAD FINDINGS Brain: Cerebral volume within normal limits. No acute intracranial hemorrhage. No acute large vessel territory infarct. No mass lesion, midline shift or mass effect. No hydrocephalus. No arachnoid cyst at the right CP angle cistern noted, stable. No other extra-axial fluid collection. Vascular: No hyperdense vessel. Skull: Scalp soft tissues and calvarium within normal limits. Sinuses: Paranasal sinuses and mastoid air cells are clear. Orbits: Globes and orbital soft tissues within normal limits. Review of the MIP images confirms the above findings CTA NECK FINDINGS Aortic arch: Visualized aortic arch of normal caliber with normal branch pattern. No flow-limiting stenosis about the origin of the great vessels. Visualized subclavian arteries widely patent. Right carotid system: Right common and internal carotid arteries widely patent without stenosis, dissection, or occlusion. No atheromatous narrowing about the right carotid bifurcation. Left carotid system: Left common and  internal carotid arteries widely patent without stenosis, dissection, or occlusion. No atheromatous narrowing about the left carotid bifurcation. Vertebral arteries: Both of the vertebral arteries arise from the subclavian arteries. Left vertebral artery dominant and widely patent to the skull base without stenosis or abnormality. Right vertebral artery is diffusely irregular in appearance with multifocal stenoses and irregularity involving the V1 and V2 segments, consistent with dissection. There is near occlusive stenosis at the proximal V2 segment (series 12, image 239). No appreciable raise flap or irregular intraluminal thrombus. Dissection essentially involves the entirety of the V2 segment as  well as the V2/V3 junction. V3 segment more normal distally, and the vertebral artery is essentially normal in appearance as it reaches the cranial vault. Skeleton: No acute osseous abnormality. No discrete lytic or blastic osseous lesions. Other neck: No other acute soft tissue abnormality within the neck. Subcentimeter hypodense left thyroid nodule noted, of doubtful significance. Upper chest: Visualized upper chest demonstrates no acute finding. Partially visualized lungs are grossly clear. Review of the MIP images confirms the above findings CTA HEAD FINDINGS Anterior circulation: Internal carotid arteries patent to the termini without stenosis. A1 segments, anterior communicating artery common anterior cerebral arteries widely patent. M1 segments widely patent. Distal MCA branches well perfused and symmetric. Posterior circulation: Vertebral arteries widely patent to the vertebrobasilar junction without stenosis. Posterior inferior cerebral arteries patent bilaterally. Basilar artery widely patent to its distal aspect without abnormality. Superior cerebellar and posterior cerebral arteries widely patent bilaterally. Small left posterior communicating artery noted. Venous sinuses: Patent. Anatomic variants: None  significant.  No intracranial aneurysm. Delayed phase: No abnormal enhancement. Review of the MIP images confirms the above findings IMPRESSION: 1. 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 2. Otherwise normal CTA of the head and neck. Critical Value/emergent results were called by telephone at the time of interpretation on 05/20/2018 at 10:11 pm to the emergency room PA Lenn Sink , who verbally acknowledged these results. Electronically Signed   By: Jeannine Boga M.D.   On: 05/20/2018 22:13   Ct Angio Neck W And/or Wo Contrast  Result Date: 05/20/2018 CLINICAL DATA:  Initial evaluation for possible right vertebral artery abnormality. EXAM: CT ANGIOGRAPHY HEAD AND NECK TECHNIQUE: Multidetector CT imaging of the head and neck was performed using the standard protocol during bolus administration of intravenous contrast. Multiplanar CT image reconstructions and MIPs were obtained to evaluate the vascular anatomy. Carotid stenosis measurements (when applicable) are obtained utilizing NASCET criteria, using the distal internal carotid diameter as the denominator. CONTRAST:  38mL ISOVUE-370 IOPAMIDOL (ISOVUE-370) INJECTION 76% COMPARISON:  Prior MRI of the cervical spine from earlier the same day. FINDINGS: CT HEAD FINDINGS Brain: Cerebral volume within normal limits. No acute intracranial hemorrhage. No acute large vessel territory infarct. No mass lesion, midline shift or mass effect. No hydrocephalus. No arachnoid cyst at the right CP angle cistern noted, stable. No other extra-axial fluid collection. Vascular: No hyperdense vessel. Skull: Scalp soft tissues and calvarium within normal limits. Sinuses: Paranasal sinuses and mastoid air cells are clear. Orbits: Globes and orbital soft tissues within normal limits. Review of the MIP images confirms the above findings CTA NECK FINDINGS Aortic arch: Visualized aortic arch of normal  caliber with normal branch pattern. No flow-limiting stenosis about the origin of the great vessels. Visualized subclavian arteries widely patent. Right carotid system: Right common and internal carotid arteries widely patent without stenosis, dissection, or occlusion. No atheromatous narrowing about the right carotid bifurcation. Left carotid system: Left common and internal carotid arteries widely patent without stenosis, dissection, or occlusion. No atheromatous narrowing about the left carotid bifurcation. Vertebral arteries: Both of the vertebral arteries arise from the subclavian arteries. Left vertebral artery dominant and widely patent to the skull base without stenosis or abnormality. Right vertebral artery is diffusely irregular in appearance with multifocal stenoses and irregularity involving the V1 and V2 segments, consistent with dissection. There is near occlusive stenosis at the proximal V2 segment (series 12, image 239). No appreciable raise flap or irregular intraluminal thrombus. Dissection  essentially involves the entirety of the V2 segment as well as the V2/V3 junction. V3 segment more normal distally, and the vertebral artery is essentially normal in appearance as it reaches the cranial vault. Skeleton: No acute osseous abnormality. No discrete lytic or blastic osseous lesions. Other neck: No other acute soft tissue abnormality within the neck. Subcentimeter hypodense left thyroid nodule noted, of doubtful significance. Upper chest: Visualized upper chest demonstrates no acute finding. Partially visualized lungs are grossly clear. Review of the MIP images confirms the above findings CTA HEAD FINDINGS Anterior circulation: Internal carotid arteries patent to the termini without stenosis. A1 segments, anterior communicating artery common anterior cerebral arteries widely patent. M1 segments widely patent. Distal MCA branches well perfused and symmetric. Posterior circulation: Vertebral arteries  widely patent to the vertebrobasilar junction without stenosis. Posterior inferior cerebral arteries patent bilaterally. Basilar artery widely patent to its distal aspect without abnormality. Superior cerebellar and posterior cerebral arteries widely patent bilaterally. Small left posterior communicating artery noted. Venous sinuses: Patent. Anatomic variants: None significant.  No intracranial aneurysm. Delayed phase: No abnormal enhancement. Review of the MIP images confirms the above findings IMPRESSION: 1. 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 2. Otherwise normal CTA of the head and neck. Critical Value/emergent results were called by telephone at the time of interpretation on 05/20/2018 at 10:11 pm to the emergency room PA Lenn Sink , who verbally acknowledged these results. Electronically Signed   By: Jeannine Boga M.D.   On: 05/20/2018 22:13   Mr Cervical Spine Wo Contrast  Addendum Date: 05/20/2018   ADDENDUM REPORT: 05/20/2018 23:23 ADDENDUM: Findings were discussed by telephone with Dr. Amil Amen at approximately 7:15 p.m. on 05/20/2018. Patient to be instructed to go to the ER for further workup and evaluation. Electronically Signed   By: Jeannine Boga M.D.   On: 05/20/2018 23:23   Result Date: 05/20/2018 CLINICAL DATA:  Initial evaluation for acute neck pain and stiffness for 9 days, worse on the right. EXAM: MRI CERVICAL SPINE WITHOUT CONTRAST TECHNIQUE: Multiplanar, multisequence MR imaging of the cervical spine was performed. No intravenous contrast was administered. COMPARISON:  Prior head CT from 05/09/2009 FINDINGS: Alignment: Straightening of the normal cervical lordosis. No listhesis. Vertebrae: Vertebral body heights well maintained without evidence for acute or chronic fracture. Bone marrow signal intensity within normal limits. No discrete or worrisome osseous lesions. No abnormal marrow  edema. Cord: Signal intensity within the cervical spinal cord is normal. Normal cord caliber and morphology. Posterior Fossa, vertebral arteries, paraspinal tissues: Benign arachnoid cyst at the right CP angle cistern partially visualize, grossly similar from previous. Visualized brain and posterior fossa otherwise unremarkable. Craniocervical junction within normal limits. Paraspinous and prevertebral soft tissues within normal limits. Irregular flow void within the right vertebral artery (series 5, image 7), of uncertain significance, but raises the possibility for acute vascular abnormality/dissection. Normal intravascular flow voids present within the left vertebral artery. Disc levels: No significant disc pathology within the cervical spine. Intervertebral discs well hydrated with preserved disc height. No disc bulge or disc protrusion. No significant facet disease. No canal or foraminal stenosis. IMPRESSION: 1. Irregular flow void within the right vertebral artery, of uncertain chronicity or significance, but could reflect an acute vascular injury/dissection in the setting of acute neck pain. Further evaluation with CTA recommended. 2. Otherwise unremarkable MRI of the cervical spine. No other acute abnormality identified. Attempt is currently being made to contact the ordering clinician.  Results will be conveyed as soon as possible. Electronically Signed: By: Jeannine Boga M.D. On: 05/20/2018 18:50    Procedures Procedures (including critical care time)  CRITICAL CARE Performed by: Barth Kirks Willye Javier  Vertebral artery dissection. Total critical care time: 46 minutes  Critical care time was exclusive of separately billable procedures and treating other patients.  Critical care was necessary to treat or prevent imminent or life-threatening deterioration.  Critical care was time spent personally by me on the following activities: development of treatment plan with patient and/or surrogate as  well as nursing, discussions with consultants, evaluation of patient's response to treatment, examination of patient, obtaining history from patient or surrogate, ordering and performing treatments and interventions, ordering and review of laboratory studies, ordering and review of radiographic studies, pulse oximetry and re-evaluation of patient's condition.  Medications Ordered in ED Medications  iopamidol (ISOVUE-370) 76 % injection (has no administration in time range)  iopamidol (ISOVUE-370) 76 % injection 50 mL (50 mLs Intravenous Contrast Given 05/20/18 2101)  aspirin EC tablet 325 mg (325 mg Oral Given 05/20/18 2335)     Initial Impression / Assessment and Plan / ED Course  I have reviewed the triage vital signs and the nursing notes.  Pertinent labs & imaging results that were available during my care of the patient were reviewed by me and considered in my medical decision making (see chart for details).     37 y.o. female presenting for right neck pain with intermittent paresthesias of the right arm that has been ongoing over the last 1 week. Patient reports symptoms began after episodes of emesis the night prior. Patient saw rheumatologist and had MRI done today of the cervical spine that showed an irregular flow void within the right vertebral artery that was concerning for a vascular injury/dissection.   Blood pressure is reassuring on presentation. History as above. Exam with possible decreased grip strength on the right when compared to the left. Question if 2/2 pain. She is otherwise neurologically intact.   CTA with Acute dissection involving the right vertebral artery, right V1 and V2 segments. Patinet blood pressure currently 124/81. Transient numbness that has now resolved. Patient has not had an MRI of the brain yet to evaluate for stroke.  Will consult to neuro hospitalist.   Dr. Rory Percy recommends 325mg  of ASA, MRI of the brain and hospitalist admission.   Case signed out  to Lorre Munroe, PA-C with consult to hospitalist pending.   Final Clinical Impressions(s) / ED Diagnoses   Final diagnoses:  Vertebral artery dissection [facility])    ED Discharge Orders    None       Jillyn Ledger, PA-C 05/21/18 0045    Jillyn Ledger, PA-C 05/21/18 0059    Daleen Bo, MD 05/21/18 1725

## 2018-05-20 NOTE — ED Triage Notes (Signed)
Pt reports rt sided neck pain for last few days with increased pain last night. Pt has Rheumatoid Arthritis and went to rheumatologist thinking it was related. Pt had an MRI and pt's Dr told her to come to the ER d/t possible dissection of the vertebral artery and was requesting CTA. Pt reports 7/10 neck pain at this time. Pt has accompanying headache and tingling radiating down rt arm. No recent injuries.

## 2018-05-20 NOTE — ED Provider Notes (Signed)
Patient placed in Quick Look pathway, seen and evaluated   Chief Complaint: Neck pain  HPI:   Sophia Costa is a 37 y.o. female who present to the ED for evaluation for abnormal MRI of neck. Patient went to her Rheumatologist today with neck pain and he ordered the MRI. When she got home he called her and told her to come to the ED for CTA with contrast because he was afraid she may have vertebral artery dissection.  The patient states that she has been having some right side neck pain for about a week but got severe today with pain radiating to the right arm.   ROS: Neck: right side neck pain  Neuro: right arm pain and tingling and numbness  Physical Exam:  BP 115/79 (BP Location: Right Arm)   Pulse 76   Temp 97.9 F (36.6 C)   Resp 20   Ht 5\' 4"  (1.626 m)   Wt 84.3 kg (185 lb 13.6 oz)   SpO2 100%   BMI 31.90 kg/m    Gen: No distress  Neuro: Awake and Alert, grips are equal  Skin: Warm and dry  Heart: tachycardia  Initiation of care has begun. The patient has been counseled on the process, plan, and necessity for staying for the completion/evaluation, and the remainder of the medical screening examination    Ashley Murrain, NP 05/21/18 1143    Tanna Furry, MD 05/31/18 1258

## 2018-05-20 NOTE — Consult Note (Signed)
Neurology Consultation  Reason for Consult: Vertebral artery dissection Referring Physician: Dr. Eulis Foster  CC: Neck pain, right-sided weakness  History is obtained from: Patient, chart  HPI: Sophia Costa is a 37 y.o. female past medical history of rheumatoid arthritis currently on Humira and methotrexate, with some right-sided pain and weakness due to arthritis, presenting to the emergency room after imaging studies showed concern for right vertebral artery dissection. Patient says she was in her usual state of health about a week or so ago when she started noticing some headache and neck pain.  This all started after an event of violent vomiting/coughing, which they thought was due to a stomach bug. Right after the violent coughing/vomiting, she started developing some headache in the back of her neck and head that would not respond to NSAIDs.  She also started having some numbness and tingling in her right arm at that time.  She went to her rheumatologist and her primary care who recommended that she get a C-spine MRI given her history of rheumatoid arthritis.  The C-spine MRI did not reveal any cervical spine issues but was concerning for a right vertebral dissection and a CT angiogram of the head and neck was recommended.  She had a CT Angie Graham head and neck, that revealed a right vertebral artery dissection V1 V2 level.  Neurological consultation was placed for the further management. She denies any visual symptoms.  Denies any chest pain.  She does have some nausea.  She had tingling and weakness in the right hand more than leg but she describes some weakness on the right side being baseline because of her rheumatoid arthritis having been asymmetrically affecting the right side more.  Patient does have a family history of multiple family members were double-jointed and are being worked up for connective tissue disorder.  Her maternal grandmother had an aneurysm in the brain.  LKW: Over 1  week ago tpa given?: no, outside the window Premorbid modified Rankin scale (mRS):0  ROS: ROS was performed and is negative except as noted in the HPI.   Past Medical History:  Diagnosis Date  . Allergy   . Chronic kidney disease    microscopic hematuria  . Rheumatoid arthritis(714.0) 7/12   Family History  Problem Relation Age of Onset  . Arthritis Mother   . Hypertension Mother   . Kidney disease Father   . Kidney disease Sister        IgA nephropathy  . Arthritis Paternal Grandmother   . Heart disease Paternal Grandfather   . Diabetes Paternal Grandfather    Social History:   reports that she has never smoked. She has never used smokeless tobacco. She reports that she drinks alcohol. She reports that she does not use drugs.  Medications  Current Facility-Administered Medications:  .  aspirin EC tablet 325 mg, 325 mg, Oral, Once, Maczis, Barth Kirks, PA-C .  iopamidol (ISOVUE-370) 76 % injection, , , ,   Current Outpatient Medications:  .  ACZONE 5 % topical gel, Once daily., Disp: , Rfl:  .  Adalimumab (HUMIRA) 10 MG/0.1ML PSKT, Inject into the skin., Disp: , Rfl:  .  azelastine (ASTELIN) 137 MCG/SPRAY nasal spray, Place 2 sprays into the nose 2 (two) times daily. Use in each nostril as directed, Disp: 30 mL, Rfl: 12 .  calcium citrate-vitamin D (CITRACAL+D) 315-200 MG-UNIT per tablet, Take 2 tablets by mouth daily., Disp: , Rfl:  .  cetirizine (ZYRTEC) 10 MG tablet, Take 10 mg by mouth  daily., Disp: , Rfl:  .  Cholecalciferol (VITAMIN D3) 1000 UNITS CAPS, Take 1 capsule by mouth daily., Disp: , Rfl:  .  citalopram (CELEXA) 20 MG tablet, Take 1 tablet (20 mg total) by mouth daily., Disp: 90 tablet, Rfl: 3 .  colestipol (COLESTID) 1 g tablet, Take 2 g by mouth daily., Disp: , Rfl: 3 .  fish oil-omega-3 fatty acids 1000 MG capsule, Take 2 g by mouth daily., Disp: , Rfl:  .  folic acid (FOLVITE) 1 MG tablet, Take 1 tablet by mouth 2 (two) times daily. , Disp: , Rfl:  .   glucosamine-chondroitin 500-400 MG tablet, Take 1 tablet by mouth 3 (three) times daily., Disp: , Rfl:  .  methotrexate 50 MG/2ML injection, Inject into the vein. Inject 25 mg/ml weekly, Disp: , Rfl:  .  ondansetron (ZOFRAN ODT) 8 MG disintegrating tablet, Take 1 tablet (8 mg total) by mouth every 8 (eight) hours as needed for nausea or vomiting., Disp: 20 tablet, Rfl: 0 .  scopolamine (TRANSDERM-SCOP) 1 MG/3DAYS, 1 patch behind ear every 72 hours PRN to prevent motion sickness., Disp: 10 patch, Rfl: 1 .  sodium chloride 0.9 % SOLN with inFLIXimab 100 MG SOLR, Inject 5 mg/kg into the vein every 8 (eight) weeks., Disp: , Rfl:   Exam: Current vital signs: BP 122/84   Pulse 98   Temp 98.5 F (36.9 C) (Oral)   Resp 20   Ht 5\' 4"  (1.626 m)   Wt 81.6 kg (180 lb)   SpO2 98%   BMI 30.90 kg/m  Vital signs in last 24 hours: Temp:  [97.9 F (36.6 C)-98.5 F (36.9 C)] 98.5 F (36.9 C) (06/27 2047) Pulse Rate:  [93-108] 98 (06/27 2230) Resp:  [20] 20 (06/27 2047) BP: (112-157)/(75-94) 122/84 (06/27 2230) SpO2:  [95 %-98 %] 98 % (06/27 2230) Weight:  [81.6 kg (180 lb)] 81.6 kg (180 lb) (06/27 2012) GENERAL: Awake, alert in NAD HEENT: - Normocephalic and atraumatic, dry mm, no LN++, no Thyromegally LUNGS - Clear to auscultation bilaterally with no wheezes CV - S1S2 RRR, no m/r/g, equal pulses bilaterally. ABDOMEN - Soft, nontender, nondistended with normoactive BS Ext: warm, well perfused, intact peripheral pulses, no edema NEURO:  Mental Status: AA&Ox3  Language: speech is clear.  Naming, repetition, fluency, and comprehension intact. Cranial Nerves: PERRL. No ptosis but the eyelids appear mildly swollen bilaterally. EOMI, visual fields full, no facial asymmetry, facial sensation intact, hearing intact, tongue/uvula/soft palate midline, normal sternocleidomastoid and trapezius muscle strength. No evidence of tongue atrophy or fibrillations Motor: Right upper extremity had vertical drift on  outstretched arms-4/5.  Right hand grip is probably a 4 out of 5-with the caveat that right-sided upper extremity exam is limited by pain in the arm and radiating to the neck.  Left upper and lower extremity 5/5.  Right lower extremity 5/5. Tone: is normal and bulk is normal Sensation- Intact to light touch bilaterally Coordination: FTN intact bilaterally, no ataxia in BLE. Gait- deferred  NIHSS-1 for vertical drift on the right arm   Labs I have reviewed labs in epic and the results pertinent to this consultation are:  CBC    Component Value Date/Time   WBC 9.0 05/20/2018 2251   RBC 4.37 05/20/2018 2251   HGB 13.7 05/20/2018 2251   HCT 40.3 05/20/2018 2251   PLT 193 05/20/2018 2251   MCV 92.2 05/20/2018 2251   MCH 31.4 05/20/2018 2251   MCHC 34.0 05/20/2018 2251   RDW 13.5 05/20/2018 2251  LYMPHSABS 3.2 05/20/2018 2251   MONOABS 1.1 (H) 05/20/2018 2251   EOSABS 0.5 05/20/2018 2251   BASOSABS 0.1 05/20/2018 2251   CMP     Component Value Date/Time   NA 138 05/20/2018 2251   NA 140 06/12/2017   K 3.7 05/20/2018 2251   CL 106 05/20/2018 2251   CO2 24 05/20/2018 2251   GLUCOSE 112 (H) 05/20/2018 2251   BUN 14 05/20/2018 2251   BUN 16 06/12/2017   CREATININE 0.94 05/20/2018 2251   CALCIUM 9.2 05/20/2018 2251   PROT 6.1 09/23/2007 2050   ALBUMIN 3.3 (L) 09/23/2007 2050   AST 23 06/12/2017   ALT 19 06/12/2017   ALKPHOS 53 06/12/2017   BILITOT 0.4 09/23/2007 2050   GFRNONAA >60 05/20/2018 2251   GFRAA >60 05/20/2018 2251   Lipid Panel     Component Value Date/Time   CHOL 215 (A) 06/12/2017   TRIG 77 06/12/2017   HDL 108 (A) 06/12/2017   CHOLHDL 1.8 07/02/2012 1707   VLDL 5 07/02/2012 1707   LDLCALC 92 06/12/2017   Imaging I have reviewed the images obtained: MRI examination of the cervical spine showed an unremarkable C-spine but irregular flow void within the right vertebral artery of unknown significance/timing. CT angiogram head and neck is suggestive of  acute right V1 and V2 segment dissection with moderate to severe diffuse stenosis.  Otherwise no other abnormalities.  Assessment:  37 year old woman with a past medical history of rheumatoid arthritis, some baseline right upper extremity pain and weakness presenting for evaluation of right-sided tingling and numbness, headache and neck pain of one-week duration. Her symptoms on further questioning started after a violent episode of coughing/vomiting a week ago. MRI of the C-spine was done because of the history of rheumatoid arthritis, which did not reveal any spinal abnormalities but did show an irregular flow-void in the right vertebral artery and on follow-up CT angiogram of the head and neck revealed a right V1 V2 dissection. She has a family history of her sister and 2 nieces having extremely flexible joints/being double jointed and are being investigated for connective tissue disorder. Maternal grandmother has history of aneurysms. I wonder if along with her rheumatoid arthritis, she might have some underlying connective tissue disorder such as Marfan's predisposing her to dissections. I would recommend admission for further work-up including a brain MRI to look for any evidence of stroke.  Impression: Right vertebral artery dissection Evaluate for stroke Evaluate for connective tissue disorder  Recommendations: -Admit to hospitalist -Telemetry monitoring -Allow for permissive hypertension for the first 24-48h - only treat PRN if SBP >220 mmHg. Blood pressures can be gradually normalized to SBP<140 upon discharge. -MRI brain without contrast-extremely painful for her to lay down straight on the table. Consider IV ativan low dose or IV dilaudid low dose to alleviate the pain for good quality images. She does not do well with Hydrocodone, has hallucinations with it. -Echocardiogram -HgbA1c, fasting lipid panel -Frequent neuro checks -Prophylactic therapy-Antiplatelet med: Aspirin - dose  325mg  PO or 300mg  PR-I had a detailed discussion with the family about extracranial dissections and the use of anticoagulation versus antiplatelets.  Until I get an MRI of the brain and evaluate for any existing stroke or the size of the stroke, I would recommend aspirin only for now.  Anticoagulant she can be considered after work-up completion per stroke team. -Atorvastatin 80 mg PO daily -Risk factor modification -PT consult, OT consult, Speech consult -Patient follows up with a rheumatologist.  I  would recommend work-up for connective tissue disorders such as Marfan's as well. -I do not see a role of ordering a hypercoagulable work-up in the young as this is more for vasculopathy and not a coagulopathy. -Pain management per primary team.  I would refrain from using NSAIDs such as possible with concomitant use of aspirin for the risk of bleeding.  Please page stroke NP/PA/MD (listed on AMION)  from 8am-4 pm as this patient will be followed by the stroke team at this point.  -- Amie Portland, MD [facility] Pager: [phone] If 7pm to 7am, please call on call as listed on AMION.

## 2018-05-20 NOTE — ED Provider Notes (Signed)
  Face-to-face evaluation   History: For evaluation of abnormal MRI neck, concerning for right-sided vertebral artery dissection.  CTA, was recommended by the radiologist.  She reports neck pain for at least a week with transient right hand numbness, which has resolved.  She is able to walk.  She is able to eat.  No weakness of arms or legs.  Physical exam: Alert, calm, cooperative.  No dysarthria or aphasia.  Grip strength, hands.  No pronator drift.  No ataxia.   Medical screening examination/treatment/procedure(s) were conducted as a shared visit with non-physician practitioner(s) and myself.  I personally evaluated the patient during the encounter    Daleen Bo, MD 05/21/18 1724

## 2018-05-21 ENCOUNTER — Encounter (HOSPITAL_COMMUNITY): Payer: Self-pay

## 2018-05-21 ENCOUNTER — Other Ambulatory Visit: Payer: Self-pay

## 2018-05-21 ENCOUNTER — Observation Stay (HOSPITAL_COMMUNITY): Payer: 59

## 2018-05-21 DIAGNOSIS — M069 Rheumatoid arthritis, unspecified: Secondary | ICD-10-CM

## 2018-05-21 DIAGNOSIS — I7774 Dissection of vertebral artery: Secondary | ICD-10-CM | POA: Diagnosis present

## 2018-05-21 LAB — LIPID PANEL
Cholesterol: 142 mg/dL (ref 0–200)
HDL: 60 mg/dL (ref >40)
LDL Cholesterol: 65 mg/dL (ref 0–99)
Total CHOL/HDL Ratio: 2.4 RATIO
Triglycerides: 86 mg/dL (ref <150)
VLDL: 17 mg/dL (ref 0–40)

## 2018-05-21 LAB — HEMOGLOBIN A1C
Hgb A1c MFr Bld: 5 % (ref 4.8–5.6)
Mean Plasma Glucose: 96.8 mg/dL

## 2018-05-21 LAB — HIV ANTIBODY (ROUTINE TESTING W REFLEX): HIV Screen 4th Generation wRfx: NONREACTIVE

## 2018-05-21 MED ORDER — STROKE: EARLY STAGES OF RECOVERY BOOK
Freq: Once | Status: AC
  Administered 2018-05-21: 03:00:00

## 2018-05-21 MED ORDER — FOLIC ACID 1 MG PO TABS
2.0000 mg | ORAL_TABLET | Freq: Every day | ORAL | Status: DC
Start: 2018-05-21 — End: 2018-05-21

## 2018-05-21 MED ORDER — SENNOSIDES-DOCUSATE SODIUM 8.6-50 MG PO TABS: 1.0000 | ORAL_TABLET | Freq: Every evening | ORAL | Status: DC | PRN

## 2018-05-21 MED ORDER — ACETAMINOPHEN 160 MG/5ML PO SOLN: 650.0000 mg | ORAL | Status: DC | PRN

## 2018-05-21 MED ORDER — TRAMADOL HCL 50 MG PO TABS
100.0000 mg | ORAL_TABLET | Freq: Four times a day (QID) | ORAL | Status: DC | PRN
  Administered 2018-05-21 (×3): 100 mg via ORAL
  Filled 2018-05-21 (×3): qty 2

## 2018-05-21 MED ORDER — ASPIRIN 325 MG PO TABS: 325.0000 mg | ORAL_TABLET | Freq: Every day | ORAL | Status: DC

## 2018-05-21 MED ORDER — COLESTIPOL HCL 1 G PO TABS
2.0000 g | ORAL_TABLET | Freq: Every day | ORAL | Status: DC
  Filled 2018-05-21: qty 2

## 2018-05-21 MED ORDER — METHOTREXATE SODIUM CHEMO INJECTION 50 MG/2ML: 10.0000 mg | INTRAMUSCULAR | Status: DC

## 2018-05-21 MED ORDER — ASPIRIN 325 MG PO TABS: 325.0000 mg | ORAL_TABLET | Freq: Every day | ORAL | 1 refills | Status: AC

## 2018-05-21 MED ORDER — ACETAMINOPHEN 325 MG PO TABS
650.0000 mg | ORAL_TABLET | ORAL | Status: DC | PRN
  Administered 2018-05-21: 650 mg via ORAL
  Filled 2018-05-21: qty 2

## 2018-05-21 MED ORDER — ACETAMINOPHEN 650 MG RE SUPP
650.0000 mg | RECTAL | Status: DC | PRN
Start: 2018-05-21 — End: 2018-05-21

## 2018-05-21 MED ORDER — ATORVASTATIN CALCIUM 80 MG PO TABS: 80.0000 mg | ORAL_TABLET | Freq: Every day | ORAL | Status: DC

## 2018-05-21 MED ORDER — CITALOPRAM HYDROBROMIDE 20 MG PO TABS
20.0000 mg | ORAL_TABLET | Freq: Every day | ORAL | Status: DC
  Filled 2018-05-21: qty 1

## 2018-05-21 NOTE — ED Provider Notes (Signed)
Patient signed out to me at shift change.  Patient with right vertebral artery dissection.  Thought to have been secondary to profuse vomiting.  She does have some right sided paresthesias.  Dr. Rory Percy from neurology, recommends aspirin and will follow along.  Recommends hospitalist admission.  Discussed patient with Dr. Denton Brick, who will admit the patient.   Montine Circle, PA-C 05/21/18 Corey Skains    Daleen Bo, MD 05/21/18 1725

## 2018-05-21 NOTE — Progress Notes (Signed)
Patient discharged home. Discharge instructions were reviewed with the pt. Pt verbalized understanding.

## 2018-05-21 NOTE — Progress Notes (Signed)
SLP Cancellation Note  Patient Details Name: Sophia Costa MRN: [id number] DOB: 04/21/1981   Cancelled treatment:       Reason Eval/Treat Not Completed: SLP screened, no needs identified, will sign off   Juan Quam Laurice 05/21/2018, 10:39 AM

## 2018-05-21 NOTE — Evaluation (Signed)
Physical Therapy Evaluation Patient Details Name: Sophia Costa MRN: [id number] DOB: 09/19/1981 Today's Date: 05/21/2018   History of Present Illness  Patient is a 37 y/o female presenting to the ED on 05/20/18 with complaints of neck pain and R UE numbness and tingling. CTA head and neck revealing an acute dissection involving right vertebral artery. Patient with a PMH significant for RA, depression.   Clinical Impression  Ms. Knisley is a very pleasant 37 y/o female admitted with the above listed diagnosis. Prior to admission patient reports she worked full time and was independent with all aspects of mobility. Patient today requiring Min A to Min guard - only as patient is fearful to move neck due to pain, not due to weakness or inability to perform task. Able to ambulate in hallway without AD and no instability or LOB noted. No further acute PT needs identified. PT to sign off, thanks for the referral.     Follow Up Recommendations No PT follow up;Supervision - Intermittent    Equipment Recommendations  None recommended by PT    Recommendations for Other Services       Precautions / Restrictions Precautions Precautions: Fall Restrictions Weight Bearing Restrictions: No      Mobility  Bed Mobility Overal bed mobility: Needs Assistance Bed Mobility: Supine to Sit     Supine to sit: HOB elevated;Min assist     General bed mobility comments: to prevent neck motion, not necessarily due to weakness/inability  Transfers Overall transfer level: Needs assistance Equipment used: 1 person hand held assist Transfers: Sit to/from Stand Sit to Stand: Min guard         General transfer comment: with patient preventing any motion at C-spine  Ambulation/Gait Ambulation/Gait assistance: Supervision Gait Distance (Feet): 200 Feet Assistive device: None Gait Pattern/deviations: Step-through pattern;Decreased stride length;Narrow base of support Gait velocity: decreased    General Gait Details: no instability or LOB noted; keeps head/neck very stable  Stairs            Wheelchair Mobility    Modified Rankin (Stroke Patients Only)       Balance Overall balance assessment: Modified Independent                                           Pertinent Vitals/Pain Pain Assessment: Faces Faces Pain Scale: Hurts little more Pain Location: neck - with movement Pain Descriptors / Indicators: Aching;Discomfort;Sore Pain Intervention(s): Limited activity within patient's tolerance;Monitored during session;Repositioned    Home Living Family/patient expects to be discharged to:: Private residence Living Arrangements: Spouse/significant other Available Help at Discharge: Family Type of Home: Apartment Home Access: Stairs to enter Entrance Stairs-Rails: None Technical brewer of Steps: 1 Home Layout: One level Home Equipment: None      Prior Function Level of Independence: Independent         Comments: works full time      Journalist, newspaper        Extremity/Trunk Assessment   Upper Extremity Assessment Upper Extremity Assessment: Defer to OT evaluation    Lower Extremity Assessment Lower Extremity Assessment: Overall WFL for tasks assessed    Cervical / Trunk Assessment Cervical / Trunk Assessment: Normal  Communication   Communication: No difficulties  Cognition Arousal/Alertness: Awake/alert Behavior During Therapy: WFL for tasks assessed/performed Overall Cognitive Status: Within Functional Limits for tasks assessed  General Comments      Exercises     Assessment/Plan    PT Assessment Patent does not need any further PT services  PT Problem List         PT Treatment Interventions      PT Goals (Current goals can be found in the Care Plan section)  Acute Rehab PT Goals Patient Stated Goal: return home and to work PT Goal Formulation: With  patient Time For Goal Achievement: 05/28/18 Potential to Achieve Goals: Good    Frequency     Barriers to discharge        Co-evaluation               AM-PAC PT "6 Clicks" Daily Activity  Outcome Measure Difficulty turning over in bed (including adjusting bedclothes, sheets and blankets)?: Unable Difficulty moving from lying on back to sitting on the side of the bed? : Unable Difficulty sitting down on and standing up from a chair with arms (e.g., wheelchair, bedside commode, etc,.)?: Unable Help needed moving to and from a bed to chair (including a wheelchair)?: A Little Help needed walking in hospital room?: A Little Help needed climbing 3-5 steps with a railing? : A Little 6 Click Score: 12    End of Session Equipment Utilized During Treatment: Gait belt Activity Tolerance: Patient tolerated treatment well Patient left: in bed;with call bell/phone within reach;with family/visitor present Nurse Communication: Mobility status PT Visit Diagnosis: Unsteadiness on feet (R26.81);Other abnormalities of gait and mobility (R26.89)    Time: 8416-6063 PT Time Calculation (min) (ACUTE ONLY): 15 min   Charges:   PT Evaluation $PT Eval Moderate Complexity: 1 Mod     PT G Codes:        Lanney Gins, PT, DPT 05/21/18 12:00 PM

## 2018-05-21 NOTE — Discharge Summary (Signed)
Discharge Summary  Sophia Costa YIR:[id number] DOB: [birth year]  PCP: Eulas Post, MD  Admit date: 05/20/2018 Discharge date:    Time spent: < 25 minutes  Admitted From: Home Disposition:  Home  Recommendations for Outpatient Follow-up:   Follow up [facility] in 8 weeks for repeat CTA of the neck to follow  Aspirin 325 mg 3-6 months  Follow up with PCP in 2 weeks  Follow up with [place]: NO Equipment/Devices: none Discharge Diagnoses:  Active Hospital Problems   Diagnosis Date Noted  . Vertebral artery dissection ([place]) 05/21/2018  . Depression, recurrent ([place]) 12/18/2013  . Rheumatoid arthritis ([place]) 07/02/2012    Resolved Hospital Problems  No resolved problems to display.    Discharge Condition: Stable  CODE STATUS: FULL Diet recommendation: Heart Healthy    Vitals:   05/21/18 0921 05/21/18 1118  BP: 115/78 115/79  Pulse:  76  Resp: 16 20  Temp: 98.5 F (36.9 C) 97.9 F (36.6 C)  SpO2: 99% 100%    History of present illness:   Sophia Costa is a 37 y.o. year old female with medical history significant for RA on humira and Methotrexate and Depresion who presented on 05/20/2018  after outpatient MRI neck ordered by her rheumatologist for evaluation of progressively worsening right sided neck pain was concerning for possible vertebral artery abnormality. She states her right sided neck pain started shortly after a "stomach bug" and some recent vomiting.  She started having progressively worsening right sided neck pain as well as numbness and tingling in her right hand. Denied any weakness or numbness outside of her hand, no falls, no chest pain, no changes in vision. CTA in ED confirmed vertebral artery dissection. Neurology was consulted in ED and recommended full dose aspirin and stroke rule out with MRI brain.   Of note patient states approximately 12 years ago she was evaluated for right sided changes in  vision, weakness and numbness that was deemed TIA related to her birth control pills.     Hospital Course:  Principal Problem:   Vertebral artery dissection (HCC) Active Problems:   Rheumatoid arthritis ([place])   Depression, recurrent ([place])   Right Vertebral Artery dissection on CTA. One week of worsening right neck pain, headache and right hand numbness/tingling preceded by emesis a few days before. Confirmed dissection on CTA neck. MRI brain ruled out stroke as recommended by Neurology. Tolerated full dose aspirin and high intensity atorvastatin here and will continue on discharge. Unsure if this is related to episode of emesis that preceded her symptoms, especially in setting of her RA. ? Possible connective tissue disease.Patient reports dx of TIA several years ago. Chart review from 2008 shows evaluation here for left sided numbess and tunnel vision by neurology. No neurologic deficits during admission and on discharge day. Outpatient follow up with neurology arranged for repeat CTA.   RA. Takes Humira and MTX weekly. Continue on discharge. Encouraged follow up with rheumatologist about workup for possible connective tissue disorders that could be contributing to presentation  Depression, stable. Home celexa    Antibiotics: none  Microbiology: none  Consultations:  Neurology   Procedures/Studies: Ct Angio Head W Or Wo Contrast  Result Date: 05/20/2018 CLINICAL DATA:  Initial evaluation for possible right vertebral artery abnormality. EXAM: CT ANGIOGRAPHY HEAD AND NECK TECHNIQUE: Multidetector CT imaging of the head and neck was performed using the standard protocol during bolus administration of intravenous contrast. Multiplanar CT image reconstructions and MIPs  were obtained to evaluate the vascular anatomy. Carotid stenosis measurements (when applicable) are obtained utilizing NASCET criteria, using the distal internal carotid diameter as the denominator. CONTRAST:  57mL  ISOVUE-370 IOPAMIDOL (ISOVUE-370) INJECTION 76% COMPARISON:  Prior MRI of the cervical spine from earlier the same day. FINDINGS: CT HEAD FINDINGS Brain: Cerebral volume within normal limits. No acute intracranial hemorrhage. No acute large vessel territory infarct. No mass lesion, midline shift or mass effect. No hydrocephalus. No arachnoid cyst at the right CP angle cistern noted, stable. No other extra-axial fluid collection. Vascular: No hyperdense vessel. Skull: Scalp soft tissues and calvarium within normal limits. Sinuses: Paranasal sinuses and mastoid air cells are clear. Orbits: Globes and orbital soft tissues within normal limits. Review of the MIP images confirms the above findings CTA NECK FINDINGS Aortic arch: Visualized aortic arch of normal caliber with normal branch pattern. No flow-limiting stenosis about the origin of the great vessels. Visualized subclavian arteries widely patent. Right carotid system: Right common and internal carotid arteries widely patent without stenosis, dissection, or occlusion. No atheromatous narrowing about the right carotid bifurcation. Left carotid system: Left common and internal carotid arteries widely patent without stenosis, dissection, or occlusion. No atheromatous narrowing about the left carotid bifurcation. Vertebral arteries: Both of the vertebral arteries arise from the subclavian arteries. Left vertebral artery dominant and widely patent to the skull base without stenosis or abnormality. Right vertebral artery is diffusely irregular in appearance with multifocal stenoses and irregularity involving the V1 and V2 segments, consistent with dissection. There is near occlusive stenosis at the proximal V2 segment (series 12, image 239). No appreciable raise flap or irregular intraluminal thrombus. Dissection essentially involves the entirety of the V2 segment as well as the V2/V3 junction. V3 segment more normal distally, and the vertebral artery is essentially  normal in appearance as it reaches the cranial vault. Skeleton: No acute osseous abnormality. No discrete lytic or blastic osseous lesions. Other neck: No other acute soft tissue abnormality within the neck. Subcentimeter hypodense left thyroid nodule noted, of doubtful significance. Upper chest: Visualized upper chest demonstrates no acute finding. Partially visualized lungs are grossly clear. Review of the MIP images confirms the above findings CTA HEAD FINDINGS Anterior circulation: Internal carotid arteries patent to the termini without stenosis. A1 segments, anterior communicating artery common anterior cerebral arteries widely patent. M1 segments widely patent. Distal MCA branches well perfused and symmetric. Posterior circulation: Vertebral arteries widely patent to the vertebrobasilar junction without stenosis. Posterior inferior cerebral arteries patent bilaterally. Basilar artery widely patent to its distal aspect without abnormality. Superior cerebellar and posterior cerebral arteries widely patent bilaterally. Small left posterior communicating artery noted. Venous sinuses: Patent. Anatomic variants: None significant.  No intracranial aneurysm. Delayed phase: No abnormal enhancement. Review of the MIP images confirms the above findings IMPRESSION: 1. 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 2. Otherwise normal CTA of the head and neck. Critical Value/emergent results were called by telephone at the time of interpretation on 05/20/2018 at 10:11 pm to the emergency room PA Lenn Sink , who verbally acknowledged these results. Electronically Signed   By: Jeannine Boga M.D.   On: 05/20/2018 22:13   Ct Angio Neck W And/or Wo Contrast  Result Date: 05/20/2018 CLINICAL DATA:  Initial evaluation for possible right vertebral artery abnormality. EXAM: CT ANGIOGRAPHY HEAD AND NECK TECHNIQUE: Multidetector CT imaging of the  head and neck was performed using the standard protocol during bolus  administration of intravenous contrast. Multiplanar CT image reconstructions and MIPs were obtained to evaluate the vascular anatomy. Carotid stenosis measurements (when applicable) are obtained utilizing NASCET criteria, using the distal internal carotid diameter as the denominator. CONTRAST:  59mL ISOVUE-370 IOPAMIDOL (ISOVUE-370) INJECTION 76% COMPARISON:  Prior MRI of the cervical spine from earlier the same day. FINDINGS: CT HEAD FINDINGS Brain: Cerebral volume within normal limits. No acute intracranial hemorrhage. No acute large vessel territory infarct. No mass lesion, midline shift or mass effect. No hydrocephalus. No arachnoid cyst at the right CP angle cistern noted, stable. No other extra-axial fluid collection. Vascular: No hyperdense vessel. Skull: Scalp soft tissues and calvarium within normal limits. Sinuses: Paranasal sinuses and mastoid air cells are clear. Orbits: Globes and orbital soft tissues within normal limits. Review of the MIP images confirms the above findings CTA NECK FINDINGS Aortic arch: Visualized aortic arch of normal caliber with normal branch pattern. No flow-limiting stenosis about the origin of the great vessels. Visualized subclavian arteries widely patent. Right carotid system: Right common and internal carotid arteries widely patent without stenosis, dissection, or occlusion. No atheromatous narrowing about the right carotid bifurcation. Left carotid system: Left common and internal carotid arteries widely patent without stenosis, dissection, or occlusion. No atheromatous narrowing about the left carotid bifurcation. Vertebral arteries: Both of the vertebral arteries arise from the subclavian arteries. Left vertebral artery dominant and widely patent to the skull base without stenosis or abnormality. Right vertebral artery is diffusely irregular in appearance with multifocal stenoses and irregularity  involving the V1 and V2 segments, consistent with dissection. There is near occlusive stenosis at the proximal V2 segment (series 12, image 239). No appreciable raise flap or irregular intraluminal thrombus. Dissection essentially involves the entirety of the V2 segment as well as the V2/V3 junction. V3 segment more normal distally, and the vertebral artery is essentially normal in appearance as it reaches the cranial vault. Skeleton: No acute osseous abnormality. No discrete lytic or blastic osseous lesions. Other neck: No other acute soft tissue abnormality within the neck. Subcentimeter hypodense left thyroid nodule noted, of doubtful significance. Upper chest: Visualized upper chest demonstrates no acute finding. Partially visualized lungs are grossly clear. Review of the MIP images confirms the above findings CTA HEAD FINDINGS Anterior circulation: Internal carotid arteries patent to the termini without stenosis. A1 segments, anterior communicating artery common anterior cerebral arteries widely patent. M1 segments widely patent. Distal MCA branches well perfused and symmetric. Posterior circulation: Vertebral arteries widely patent to the vertebrobasilar junction without stenosis. Posterior inferior cerebral arteries patent bilaterally. Basilar artery widely patent to its distal aspect without abnormality. Superior cerebellar and posterior cerebral arteries widely patent bilaterally. Small left posterior communicating artery noted. Venous sinuses: Patent. Anatomic variants: None significant.  No intracranial aneurysm. Delayed phase: No abnormal enhancement. Review of the MIP images confirms the above findings IMPRESSION: 1. 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 2. Otherwise normal CTA of the head and neck. Critical Value/emergent results were called by telephone at the time of interpretation on 05/20/2018 at 10:11 pm to the  emergency room PA Lenn Sink , who verbally acknowledged these results. Electronically Signed   By: Jeannine Boga M.D.   On: 05/20/2018 22:13   Mr Brain Wo Contrast  Result Date: 05/21/2018 CLINICAL DATA:  Vertebral artery dissection. History of rheumatoid arthritis. EXAM: MRI HEAD WITHOUT CONTRAST TECHNIQUE: Multiplanar, multiecho pulse sequences of the brain and surrounding structures were obtained  without intravenous contrast. COMPARISON:  CTA 05/20/2018 FINDINGS: Brain: Negative for acute or chronic infarction. Negative for hemorrhage mass or edema. Ventricular size normal. Cerebral volume normal. Vascular: Increased signal distal right vertebral artery which may be due to slow flow from proximal dissection as noted on prior CTA. Remaining circle of Willis vessels have normal flow voids. Skull and upper cervical spine: Negative Sinuses/Orbits: Mild mucosal edema paranasal sinuses Other: None IMPRESSION: Normal MRI of the brain.  Negative for infarct Increased signal distal right vertebral artery likely due to slow flow from known dissection of the right vertebral artery. This vessel is patent on CTA yesterday. Electronically Signed   By: Franchot Gallo M.D.   On: 05/21/2018 10:32   Mr Cervical Spine Wo Contrast  Addendum Date: 05/20/2018   ADDENDUM REPORT: 05/20/2018 23:23 ADDENDUM: Findings were discussed by telephone with Dr. Amil Amen at approximately 7:15 p.m. on 05/20/2018. Patient to be instructed to go to the ER for further workup and evaluation. Electronically Signed   By: Jeannine Boga M.D.   On: 05/20/2018 23:23   Result Date: 05/20/2018 CLINICAL DATA:  Initial evaluation for acute neck pain and stiffness for 9 days, worse on the right. EXAM: MRI CERVICAL SPINE WITHOUT CONTRAST TECHNIQUE: Multiplanar, multisequence MR imaging of the cervical spine was performed. No intravenous contrast was administered. COMPARISON:  Prior head CT from 05/09/2009 FINDINGS: Alignment: Straightening  of the normal cervical lordosis. No listhesis. Vertebrae: Vertebral body heights well maintained without evidence for acute or chronic fracture. Bone marrow signal intensity within normal limits. No discrete or worrisome osseous lesions. No abnormal marrow edema. Cord: Signal intensity within the cervical spinal cord is normal. Normal cord caliber and morphology. Posterior Fossa, vertebral arteries, paraspinal tissues: Benign arachnoid cyst at the right CP angle cistern partially visualize, grossly similar from previous. Visualized brain and posterior fossa otherwise unremarkable. Craniocervical junction within normal limits. Paraspinous and prevertebral soft tissues within normal limits. Irregular flow void within the right vertebral artery (series 5, image 7), of uncertain significance, but raises the possibility for acute vascular abnormality/dissection. Normal intravascular flow voids present within the left vertebral artery. Disc levels: No significant disc pathology within the cervical spine. Intervertebral discs well hydrated with preserved disc height. No disc bulge or disc protrusion. No significant facet disease. No canal or foraminal stenosis. IMPRESSION: 1. Irregular flow void within the right vertebral artery, of uncertain chronicity or significance, but could reflect an acute vascular injury/dissection in the setting of acute neck pain. Further evaluation with CTA recommended. 2. Otherwise unremarkable MRI of the cervical spine. No other acute abnormality identified. Attempt is currently being made to contact the ordering clinician. Results will be conveyed as soon as possible. Electronically Signed: By: Jeannine Boga M.D. On: 05/20/2018 18:50      Discharge Exam: BP 115/79 (BP Location: Right Arm)   Pulse 76   Temp 97.9 F (36.6 C)   Resp 20   Ht 5\' 4"  (1.626 m)   Wt 84.3 kg (185 lb 13.6 oz)   SpO2 100%   BMI 31.90 kg/m   Constitutional:normal appearing young female, in no  distress Eyes: EOMI, anicteric, normal conjunctivae ENMT: Oropharynx with moist mucous membranes, normal dentition Cardiovascular: RRR no MRGs, with no peripheral edema Respiratory: Normal respiratory effort, clear breath sounds  Abdomen: Soft,non-tender, with no HSM Skin: No rash ulcers, or lesions. Without skin tenting  Neurologic: 5/5 strength in left extremities. 4/5 strength in right arm.  Psychiatric:Appropriate affect, and mood. Mental status AAOx3  Discharge Instructions You were cared for by a hospitalist during your hospital stay. If you have any questions about your discharge medications or the care you received while you were in the hospital after you are discharged, you can call the unit and asked to speak with the hospitalist on call if the hospitalist that took care of you is not available. Once you are discharged, your primary care physician will handle any further medical issues. Please note that NO REFILLS for any discharge medications will be authorized once you are discharged, as it is imperative that you return to your primary care physician (or establish a relationship with a primary care physician if you do not have one) for your aftercare needs so that they can reassess your need for medications and monitor your lab values.  Discharge Instructions    Diet - low sodium heart healthy   Complete by:  As directed    Increase activity slowly   Complete by:  As directed      Allergies as of 05/21/2018      Reactions   Prednisone Shortness Of Breath   Reacted to a dose greater than 10 mg (1st day of a taper pack)      Medication List    TAKE these medications   aspirin 325 MG tablet Take 1 tablet (325 mg total) by mouth daily.   azelastine 0.1 % nasal spray Commonly known as:  ASTELIN Place 2 sprays into the nose 2 (two) times daily. Use in each nostril as directed   calcium citrate-vitamin D 315-200 MG-UNIT tablet Commonly known as:  CITRACAL+D Take 2  tablets by mouth daily.   cetirizine 10 MG tablet Commonly known as:  ZYRTEC Take 10 mg by mouth at bedtime.   citalopram 20 MG tablet Commonly known as:  CELEXA Take 1 tablet (20 mg total) by mouth daily. What changed:  when to take this   colestipol 1 g tablet Commonly known as:  COLESTID Take 2 g by mouth daily.   fish oil-omega-3 fatty acids 1000 MG capsule Take 2 g by mouth daily.   folic acid 1 MG tablet Commonly known as:  FOLVITE Take 2 mg by mouth at bedtime.   glucosamine-chondroitin 500-400 MG tablet Take 1 tablet by mouth daily.   HUMIRA 40 MG/0.4ML Pskt Generic drug:  Adalimumab Inject 40 mg into the skin every 14 (fourteen) days.   ibuprofen 200 MG tablet Commonly known as:  ADVIL,MOTRIN Take 800 mg by mouth every 4 (four) hours as needed (for pain).   methotrexate 50 MG/2ML injection Inject 10 mg into the skin See admin instructions. Inject 10 mg once a week every Friday at bedtime   ondansetron 8 MG disintegrating tablet Commonly known as:  ZOFRAN ODT Take 1 tablet (8 mg total) by mouth every 8 (eight) hours as needed for nausea or vomiting.   predniSONE 10 MG tablet Commonly known as:  DELTASONE Take 10 mg by mouth 2 (two) times daily as needed (for R.A. flares).   scopolamine 1 MG/3DAYS Commonly known as:  TRANSDERM-SCOP 1 patch behind ear every 72 hours PRN to prevent motion sickness.   traMADol 50 MG tablet Commonly known as:  ULTRAM Take 50 mg by mouth every 6 (six) hours as needed (for pain).   Vitamin D3 1000 units Caps Take 1,000 Units by mouth daily.      Allergies  Allergen Reactions  . Prednisone Shortness Of Breath    Reacted to a dose greater than 10 mg (  1st day of a taper pack)      The results of significant diagnostics from this hospitalization (including imaging, microbiology, ancillary and laboratory) are listed below for reference.    Significant Diagnostic Studies: Ct Angio Head W Or Wo Contrast  Result Date:  05/20/2018 CLINICAL DATA:  Initial evaluation for possible right vertebral artery abnormality. EXAM: CT ANGIOGRAPHY HEAD AND NECK TECHNIQUE: Multidetector CT imaging of the head and neck was performed using the standard protocol during bolus administration of intravenous contrast. Multiplanar CT image reconstructions and MIPs were obtained to evaluate the vascular anatomy. Carotid stenosis measurements (when applicable) are obtained utilizing NASCET criteria, using the distal internal carotid diameter as the denominator. CONTRAST:  4mL ISOVUE-370 IOPAMIDOL (ISOVUE-370) INJECTION 76% COMPARISON:  Prior MRI of the cervical spine from earlier the same day. FINDINGS: CT HEAD FINDINGS Brain: Cerebral volume within normal limits. No acute intracranial hemorrhage. No acute large vessel territory infarct. No mass lesion, midline shift or mass effect. No hydrocephalus. No arachnoid cyst at the right CP angle cistern noted, stable. No other extra-axial fluid collection. Vascular: No hyperdense vessel. Skull: Scalp soft tissues and calvarium within normal limits. Sinuses: Paranasal sinuses and mastoid air cells are clear. Orbits: Globes and orbital soft tissues within normal limits. Review of the MIP images confirms the above findings CTA NECK FINDINGS Aortic arch: Visualized aortic arch of normal caliber with normal branch pattern. No flow-limiting stenosis about the origin of the great vessels. Visualized subclavian arteries widely patent. Right carotid system: Right common and internal carotid arteries widely patent without stenosis, dissection, or occlusion. No atheromatous narrowing about the right carotid bifurcation. Left carotid system: Left common and internal carotid arteries widely patent without stenosis, dissection, or occlusion. No atheromatous narrowing about the left carotid bifurcation. Vertebral arteries: Both of the vertebral arteries arise from the subclavian arteries. Left vertebral artery dominant and  widely patent to the skull base without stenosis or abnormality. Right vertebral artery is diffusely irregular in appearance with multifocal stenoses and irregularity involving the V1 and V2 segments, consistent with dissection. There is near occlusive stenosis at the proximal V2 segment (series 12, image 239). No appreciable raise flap or irregular intraluminal thrombus. Dissection essentially involves the entirety of the V2 segment as well as the V2/V3 junction. V3 segment more normal distally, and the vertebral artery is essentially normal in appearance as it reaches the cranial vault. Skeleton: No acute osseous abnormality. No discrete lytic or blastic osseous lesions. Other neck: No other acute soft tissue abnormality within the neck. Subcentimeter hypodense left thyroid nodule noted, of doubtful significance. Upper chest: Visualized upper chest demonstrates no acute finding. Partially visualized lungs are grossly clear. Review of the MIP images confirms the above findings CTA HEAD FINDINGS Anterior circulation: Internal carotid arteries patent to the termini without stenosis. A1 segments, anterior communicating artery common anterior cerebral arteries widely patent. M1 segments widely patent. Distal MCA branches well perfused and symmetric. Posterior circulation: Vertebral arteries widely patent to the vertebrobasilar junction without stenosis. Posterior inferior cerebral arteries patent bilaterally. Basilar artery widely patent to its distal aspect without abnormality. Superior cerebellar and posterior cerebral arteries widely patent bilaterally. Small left posterior communicating artery noted. Venous sinuses: Patent. Anatomic variants: None significant.  No intracranial aneurysm. Delayed phase: No abnormal enhancement. Review of the MIP images confirms the above findings IMPRESSION: 1. 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 2.  Otherwise normal CTA of the head and neck.  Critical Value/emergent results were called by telephone at the time of interpretation on 05/20/2018 at 10:11 pm to the emergency room PA Lenn Sink , who verbally acknowledged these results. Electronically Signed   By: Jeannine Boga M.D.   On: 05/20/2018 22:13   Ct Angio Neck W And/or Wo Contrast  Result Date: 05/20/2018 CLINICAL DATA:  Initial evaluation for possible right vertebral artery abnormality. EXAM: CT ANGIOGRAPHY HEAD AND NECK TECHNIQUE: Multidetector CT imaging of the head and neck was performed using the standard protocol during bolus administration of intravenous contrast. Multiplanar CT image reconstructions and MIPs were obtained to evaluate the vascular anatomy. Carotid stenosis measurements (when applicable) are obtained utilizing NASCET criteria, using the distal internal carotid diameter as the denominator. CONTRAST:  71mL ISOVUE-370 IOPAMIDOL (ISOVUE-370) INJECTION 76% COMPARISON:  Prior MRI of the cervical spine from earlier the same day. FINDINGS: CT HEAD FINDINGS Brain: Cerebral volume within normal limits. No acute intracranial hemorrhage. No acute large vessel territory infarct. No mass lesion, midline shift or mass effect. No hydrocephalus. No arachnoid cyst at the right CP angle cistern noted, stable. No other extra-axial fluid collection. Vascular: No hyperdense vessel. Skull: Scalp soft tissues and calvarium within normal limits. Sinuses: Paranasal sinuses and mastoid air cells are clear. Orbits: Globes and orbital soft tissues within normal limits. Review of the MIP images confirms the above findings CTA NECK FINDINGS Aortic arch: Visualized aortic arch of normal caliber with normal branch pattern. No flow-limiting stenosis about the origin of the great vessels. Visualized subclavian arteries widely patent. Right carotid system: Right common and internal carotid arteries widely patent without stenosis, dissection, or occlusion. No  atheromatous narrowing about the right carotid bifurcation. Left carotid system: Left common and internal carotid arteries widely patent without stenosis, dissection, or occlusion. No atheromatous narrowing about the left carotid bifurcation. Vertebral arteries: Both of the vertebral arteries arise from the subclavian arteries. Left vertebral artery dominant and widely patent to the skull base without stenosis or abnormality. Right vertebral artery is diffusely irregular in appearance with multifocal stenoses and irregularity involving the V1 and V2 segments, consistent with dissection. There is near occlusive stenosis at the proximal V2 segment (series 12, image 239). No appreciable raise flap or irregular intraluminal thrombus. Dissection essentially involves the entirety of the V2 segment as well as the V2/V3 junction. V3 segment more normal distally, and the vertebral artery is essentially normal in appearance as it reaches the cranial vault. Skeleton: No acute osseous abnormality. No discrete lytic or blastic osseous lesions. Other neck: No other acute soft tissue abnormality within the neck. Subcentimeter hypodense left thyroid nodule noted, of doubtful significance. Upper chest: Visualized upper chest demonstrates no acute finding. Partially visualized lungs are grossly clear. Review of the MIP images confirms the above findings CTA HEAD FINDINGS Anterior circulation: Internal carotid arteries patent to the termini without stenosis. A1 segments, anterior communicating artery common anterior cerebral arteries widely patent. M1 segments widely patent. Distal MCA branches well perfused and symmetric. Posterior circulation: Vertebral arteries widely patent to the vertebrobasilar junction without stenosis. Posterior inferior cerebral arteries patent bilaterally. Basilar artery widely patent to its distal aspect without abnormality. Superior cerebellar and posterior cerebral arteries widely patent bilaterally.  Small left posterior communicating artery noted. Venous sinuses: Patent. Anatomic variants: None significant.  No intracranial aneurysm. Delayed phase: No abnormal enhancement. Review of the MIP images confirms the above findings IMPRESSION: 1. 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  2. Otherwise normal CTA of the head and neck. Critical Value/emergent results were called by telephone at the time of interpretation on 05/20/2018 at 10:11 pm to the emergency room PA Lenn Sink , who verbally acknowledged these results. Electronically Signed   By: Jeannine Boga M.D.   On: 05/20/2018 22:13   Mr Brain Wo Contrast  Result Date: 05/21/2018 CLINICAL DATA:  Vertebral artery dissection. History of rheumatoid arthritis. EXAM: MRI HEAD WITHOUT CONTRAST TECHNIQUE: Multiplanar, multiecho pulse sequences of the brain and surrounding structures were obtained without intravenous contrast. COMPARISON:  CTA 05/20/2018 FINDINGS: Brain: Negative for acute or chronic infarction. Negative for hemorrhage mass or edema. Ventricular size normal. Cerebral volume normal. Vascular: Increased signal distal right vertebral artery which may be due to slow flow from proximal dissection as noted on prior CTA. Remaining circle of Willis vessels have normal flow voids. Skull and upper cervical spine: Negative Sinuses/Orbits: Mild mucosal edema paranasal sinuses Other: None IMPRESSION: Normal MRI of the brain.  Negative for infarct Increased signal distal right vertebral artery likely due to slow flow from known dissection of the right vertebral artery. This vessel is patent on CTA yesterday. Electronically Signed   By: Franchot Gallo M.D.   On: 05/21/2018 10:32   Mr Cervical Spine Wo Contrast  Addendum Date: 05/20/2018   ADDENDUM REPORT: 05/20/2018 23:23 ADDENDUM: Findings were discussed by telephone with Dr. Amil Amen at approximately 7:15 p.m. on  05/20/2018. Patient to be instructed to go to the ER for further workup and evaluation. Electronically Signed   By: Jeannine Boga M.D.   On: 05/20/2018 23:23   Result Date: 05/20/2018 CLINICAL DATA:  Initial evaluation for acute neck pain and stiffness for 9 days, worse on the right. EXAM: MRI CERVICAL SPINE WITHOUT CONTRAST TECHNIQUE: Multiplanar, multisequence MR imaging of the cervical spine was performed. No intravenous contrast was administered. COMPARISON:  Prior head CT from 05/09/2009 FINDINGS: Alignment: Straightening of the normal cervical lordosis. No listhesis. Vertebrae: Vertebral body heights well maintained without evidence for acute or chronic fracture. Bone marrow signal intensity within normal limits. No discrete or worrisome osseous lesions. No abnormal marrow edema. Cord: Signal intensity within the cervical spinal cord is normal. Normal cord caliber and morphology. Posterior Fossa, vertebral arteries, paraspinal tissues: Benign arachnoid cyst at the right CP angle cistern partially visualize, grossly similar from previous. Visualized brain and posterior fossa otherwise unremarkable. Craniocervical junction within normal limits. Paraspinous and prevertebral soft tissues within normal limits. Irregular flow void within the right vertebral artery (series 5, image 7), of uncertain significance, but raises the possibility for acute vascular abnormality/dissection. Normal intravascular flow voids present within the left vertebral artery. Disc levels: No significant disc pathology within the cervical spine. Intervertebral discs well hydrated with preserved disc height. No disc bulge or disc protrusion. No significant facet disease. No canal or foraminal stenosis. IMPRESSION: 1. Irregular flow void within the right vertebral artery, of uncertain chronicity or significance, but could reflect an acute vascular injury/dissection in the setting of acute neck pain. Further evaluation with CTA  recommended. 2. Otherwise unremarkable MRI of the cervical spine. No other acute abnormality identified. Attempt is currently being made to contact the ordering clinician. Results will be conveyed as soon as possible. Electronically Signed: By: Jeannine Boga M.D. On: 05/20/2018 18:50    Microbiology: No results found for this or any previous visit (from the past 240 hour(s)).   Labs: Basic Metabolic Panel: Recent Labs  Lab 05/20/18 2028 05/20/18 2251  NA 139 138  K 3.6 3.7  CL 104 106  CO2  --  24  GLUCOSE 125* 112*  BUN 16 14  CREATININE 0.90 0.94  CALCIUM  --  9.2   Liver Function Tests: No results for input(s): AST, ALT, ALKPHOS, BILITOT, PROT, ALBUMIN in the last 168 hours. No results for input(s): LIPASE, AMYLASE in the last 168 hours. No results for input(s): AMMONIA in the last 168 hours. CBC: Recent Labs  Lab 05/20/18 2028 05/20/18 2251  WBC  --  9.0  NEUTROABS  --  4.1  HGB 15.3* 13.7  HCT 45.0 40.3  MCV  --  92.2  PLT  --  193   Cardiac Enzymes: No results for input(s): CKTOTAL, CKMB, CKMBINDEX, TROPONINI in the last 168 hours. BNP: BNP (last 3 results) No results for input(s): BNP in the last 8760 hours.  ProBNP (last 3 results) No results for input(s): PROBNP in the last 8760 hours.  CBG: No results for input(s): GLUCAP in the last 168 hours.     Signed:  Desiree Hane, MD [facility] 05/21/2018, 3:27 PM

## 2018-05-21 NOTE — Procedures (Signed)
Please call echo department if echo is still needed since MRI results are negative for stroke.

## 2018-05-21 NOTE — H&P (Addendum)
History and Physical    Sophia Costa QMG:[id number] DOB: [birth year] DOA: 05/20/2018  PCP: Eulas Post, MD   Patient coming from: Home   Chief Complaint: Right neck pain  HPI: Sophia Costa is a 37 y.o. female with medical history significant for rheumatoid arthritis, depression who presented to the ED with complaints of left neck pain of about a week duration, with tingling and numbness down her right upper arm for the past few days.  Patient denies headache, heaviness or weakness of her extremities, slurred speech, facial asymmetry.  At the time of my evaluation patient reports resolution in numbness/tingling but persistent right sided neck pain.  Patient saw her rheumatologist - Dr. Amil Amen, today for same complaints of neck pain, MRI spine showed irregular flow void within the right vertebral artery of uncertain chronicity or significance, but could reflect an acute vascular injury/dissection in the setting of acute neck pain.  CTA was recommended.  Patient was contacted and told to come to the ED.  ED Course: Initial tachycardia 108, resolved, blood pressure systolic 891- 694.  Glucose 112 otherwise BMP, CBC unremarkable.  EKG-ST or T wave abnormalities. CTA head and neck -acute dissection involving right vertebral artery, right V1 and V2 segments with moderate to severe diffuse stenosis. Patient was evaluated by neurology Dr. Aroor-recommended hospitalist admission, stroke work-up, MRI brain, and start patient on aspirin 336m, and an MRI brain.  Review of Systems: As per HPI otherwise 10 point review of systems negative.   Past Medical History:  Diagnosis Date  . Allergy   . Chronic kidney disease    microscopic hematuria  . Rheumatoid arthritis(714.0) 7/12    Past Surgical History:  Procedure Laterality Date  . CHOLECYSTECTOMY       reports that she has never smoked. She has never used smokeless tobacco. She reports that she drinks alcohol. She reports that she  does not use drugs.  Allergies  Allergen Reactions  . Prednisone Shortness Of Breath    Reacted to a dose greater than 10 mg (1st day of a taper pack)    Family History  Problem Relation Age of Onset  . Arthritis Mother   . Hypertension Mother   . Kidney disease Father   . Kidney disease Sister        IgA nephropathy  . Arthritis Paternal Grandmother   . Heart disease Paternal Grandfather   . Diabetes Paternal Grandfather     Prior to Admission medications   Medication Sig Start Date End Date Taking? Authorizing Provider  Adalimumab (HUMIRA) 40 MG/0.4ML PSKT Inject 40 mg into the skin every 14 (fourteen) days.   Yes [provider]  calcium citrate-vitamin D (CITRACAL+D) 315-200 MG-UNIT per tablet Take 2 tablets by mouth daily.   Yes [provider]  cetirizine (ZYRTEC) 10 MG tablet Take 10 mg by mouth at bedtime.    Yes [provider]  Cholecalciferol (VITAMIN D3) 1000 UNITS CAPS Take 1,000 Units by mouth daily.    Yes [provider]  citalopram (CELEXA) 20 MG tablet Take 1 tablet (20 mg total) by mouth daily. Patient taking differently: Take 20 mg by mouth at bedtime.  12/07/17  Yes Burchette, BAlinda Sierras MD  colestipol (COLESTID) 1 g tablet Take 2 g by mouth daily. 05/08/18  Yes [provider]  fish oil-omega-3 fatty acids 1000 MG capsule Take 2 g by mouth daily.   Yes [provider]  folic acid (FOLVITE) 1 MG tablet Take 2 mg by  mouth at bedtime.  05/30/12  Yes [provider]  glucosamine-chondroitin 500-400 MG tablet Take 1 tablet by mouth daily.    Yes [provider]  ibuprofen (ADVIL,MOTRIN) 200 MG tablet Take 800 mg by mouth every 4 (four) hours as needed (for pain).   Yes [provider]  methotrexate 50 MG/2ML injection Inject 10 mg into the skin See admin instructions. Inject 10 mg once a week every Friday at bedtime   Yes [provider]  predniSONE (DELTASONE) 10 MG tablet Take 10  mg by mouth 2 (two) times daily as needed (for R.A. flares).   Yes [provider]  scopolamine (TRANSDERM-SCOP) 1 MG/3DAYS 1 patch behind ear every 72 hours PRN to prevent motion sickness. 12/07/17  Yes Burchette, Alinda Sierras, MD  traMADol (ULTRAM) 50 MG tablet Take 50 mg by mouth every 6 (six) hours as needed (for pain).   Yes [provider]  azelastine (ASTELIN) 137 MCG/SPRAY nasal spray Place 2 sprays into the nose 2 (two) times daily. Use in each nostril as directed Patient not taking: Reported on 05/20/2018 06/10/13   Eulas Post, MD  ondansetron (ZOFRAN ODT) 8 MG disintegrating tablet Take 1 tablet (8 mg total) by mouth every 8 (eight) hours as needed for nausea or vomiting. Patient not taking: Reported on 05/20/2018 09/22/16   Delano Metz, FNP    Physical Exam: Vitals:   05/20/18 2230 05/20/18 2300 05/20/18 2330 05/20/18 2345  BP: 122/84 119/84 115/75 118/77  Pulse: 98 95 90 96  Resp:  (!) _0 Temp:      TempSrc:      SpO2: 98% 97% 95% 95%  Weight:      Height:        Constitutional: NAD, calm, comfortable Vitals:   05/20/18 2230 05/20/18 2300 05/20/18 2330 05/20/18 2345  BP: 122/84 119/84 115/75 118/77  Pulse: 98 95 90 96  Resp:  (!) _1 Temp:      TempSrc:      SpO2: 98% 97% 95% 95%  Weight:      Height:       Eyes: PERRL, lids and conjunctivae normal ENMT: Mucous membranes are moist. Posterior pharynx clear of any exudate or lesions.Normal dentition.  Neck: normal, supple, no masses, no thyromegaly Respiratory: clear to auscultation bilaterally, no wheezing, no crackles. Normal respiratory effort. No accessory muscle use.  Cardiovascular: Regular rate and rhythm, no murmurs / rubs / gallops. No extremity edema. 2+ pedal pulses. No carotid bruits.  Abdomen: no tenderness, no masses palpated. No hepatosplenomegaly. Bowel sounds positive.  Musculoskeletal: no clubbing / cyanosis. No joint deformity upper and lower extremities. Good  ROM, no contractures. Normal muscle tone.  Skin: no rashes, lesions, ulcers. No induration Neurologic: CN 2-12 grossly intact. Sensation intact, Strength deficit 4/5  -right upper and lower extremity. 5/5 left upper and lower extremity. Psychiatric: Normal judgment and insight. Alert and oriented x 3. Normal mood.   Labs on Admission: I have personally reviewed following labs and imaging studies  CBC: Recent Labs  Lab 05/20/18 2028 05/20/18 2251  WBC  --  9.0  NEUTROABS  --  4.1  HGB 15.3* 13.7  HCT 45.0 40.3  MCV  --  92.2  PLT  --  242   Basic Metabolic Panel: Recent Labs  Lab 05/20/18 2028 05/20/18 2251  NA 139 138  K 3.6 3.7  CL 104 106  CO2  --  24  GLUCOSE 125* 112*  BUN  16 14  CREATININE 0.90 0.94  CALCIUM  --  9.2   Radiological Exams on Admission: Ct Angio Head W Or Wo Contrast  Result Date: 05/20/2018 CLINICAL DATA:  Initial evaluation for possible right vertebral artery abnormality. EXAM: CT ANGIOGRAPHY HEAD AND NECK TECHNIQUE: Multidetector CT imaging of the head and neck was performed using the standard protocol during bolus administration of intravenous contrast. Multiplanar CT image reconstructions and MIPs were obtained to evaluate the vascular anatomy. Carotid stenosis measurements (when applicable) are obtained utilizing NASCET criteria, using the distal internal carotid diameter as the denominator. CONTRAST:  28m ISOVUE-370 IOPAMIDOL (ISOVUE-370) INJECTION 76% COMPARISON:  Prior MRI of the cervical spine from earlier the same day. FINDINGS: CT HEAD FINDINGS Brain: Cerebral volume within normal limits. No acute intracranial hemorrhage. No acute large vessel territory infarct. No mass lesion, midline shift or mass effect. No hydrocephalus. No arachnoid cyst at the right CP angle cistern noted, stable. No other extra-axial fluid collection. Vascular: No hyperdense vessel. Skull: Scalp soft tissues and calvarium within normal limits. Sinuses: Paranasal sinuses and  mastoid air cells are clear. Orbits: Globes and orbital soft tissues within normal limits. Review of the MIP images confirms the above findings CTA NECK FINDINGS Aortic arch: Visualized aortic arch of normal caliber with normal branch pattern. No flow-limiting stenosis about the origin of the great vessels. Visualized subclavian arteries widely patent. Right carotid system: Right common and internal carotid arteries widely patent without stenosis, dissection, or occlusion. No atheromatous narrowing about the right carotid bifurcation. Left carotid system: Left common and internal carotid arteries widely patent without stenosis, dissection, or occlusion. No atheromatous narrowing about the left carotid bifurcation. Vertebral arteries: Both of the vertebral arteries arise from the subclavian arteries. Left vertebral artery dominant and widely patent to the skull base without stenosis or abnormality. Right vertebral artery is diffusely irregular in appearance with multifocal stenoses and irregularity involving the V1 and V2 segments, consistent with dissection. There is near occlusive stenosis at the proximal V2 segment (series 12, image 239). No appreciable raise flap or irregular intraluminal thrombus. Dissection essentially involves the entirety of the V2 segment as well as the V2/V3 junction. V3 segment more normal distally, and the vertebral artery is essentially normal in appearance as it reaches the cranial vault. Skeleton: No acute osseous abnormality. No discrete lytic or blastic osseous lesions. Other neck: No other acute soft tissue abnormality within the neck. Subcentimeter hypodense left thyroid nodule noted, of doubtful significance. Upper chest: Visualized upper chest demonstrates no acute finding. Partially visualized lungs are grossly clear. Review of the MIP images confirms the above findings CTA HEAD FINDINGS Anterior circulation: Internal carotid arteries patent to the termini without stenosis. A1  segments, anterior communicating artery common anterior cerebral arteries widely patent. M1 segments widely patent. Distal MCA branches well perfused and symmetric. Posterior circulation: Vertebral arteries widely patent to the vertebrobasilar junction without stenosis. Posterior inferior cerebral arteries patent bilaterally. Basilar artery widely patent to its distal aspect without abnormality. Superior cerebellar and posterior cerebral arteries widely patent bilaterally. Small left posterior communicating artery noted. Venous sinuses: Patent. Anatomic variants: None significant.  No intracranial aneurysm. Delayed phase: No abnormal enhancement. Review of the MIP images confirms the above findings IMPRESSION: 1. 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 2. Otherwise normal CTA of the head and neck. Critical Value/emergent results were called by telephone at the time of interpretation on 05/20/2018 at 10:11 pm to the  emergency room PA Lenn Sink , who verbally acknowledged these results. Electronically Signed   By: Jeannine Boga M.D.   On: 05/20/2018 22:13   Ct Angio Neck W And/or Wo Contrast  Result Date: 05/20/2018 CLINICAL DATA:  Initial evaluation for possible right vertebral artery abnormality. EXAM: CT ANGIOGRAPHY HEAD AND NECK TECHNIQUE: Multidetector CT imaging of the head and neck was performed using the standard protocol during bolus administration of intravenous contrast. Multiplanar CT image reconstructions and MIPs were obtained to evaluate the vascular anatomy. Carotid stenosis measurements (when applicable) are obtained utilizing NASCET criteria, using the distal internal carotid diameter as the denominator. CONTRAST:  70m ISOVUE-370 IOPAMIDOL (ISOVUE-370) INJECTION 76% COMPARISON:  Prior MRI of the cervical spine from earlier the same day. FINDINGS: CT HEAD FINDINGS Brain: Cerebral volume within normal limits.  No acute intracranial hemorrhage. No acute large vessel territory infarct. No mass lesion, midline shift or mass effect. No hydrocephalus. No arachnoid cyst at the right CP angle cistern noted, stable. No other extra-axial fluid collection. Vascular: No hyperdense vessel. Skull: Scalp soft tissues and calvarium within normal limits. Sinuses: Paranasal sinuses and mastoid air cells are clear. Orbits: Globes and orbital soft tissues within normal limits. Review of the MIP images confirms the above findings CTA NECK FINDINGS Aortic arch: Visualized aortic arch of normal caliber with normal branch pattern. No flow-limiting stenosis about the origin of the great vessels. Visualized subclavian arteries widely patent. Right carotid system: Right common and internal carotid arteries widely patent without stenosis, dissection, or occlusion. No atheromatous narrowing about the right carotid bifurcation. Left carotid system: Left common and internal carotid arteries widely patent without stenosis, dissection, or occlusion. No atheromatous narrowing about the left carotid bifurcation. Vertebral arteries: Both of the vertebral arteries arise from the subclavian arteries. Left vertebral artery dominant and widely patent to the skull base without stenosis or abnormality. Right vertebral artery is diffusely irregular in appearance with multifocal stenoses and irregularity involving the V1 and V2 segments, consistent with dissection. There is near occlusive stenosis at the proximal V2 segment (series 12, image 239). No appreciable raise flap or irregular intraluminal thrombus. Dissection essentially involves the entirety of the V2 segment as well as the V2/V3 junction. V3 segment more normal distally, and the vertebral artery is essentially normal in appearance as it reaches the cranial vault. Skeleton: No acute osseous abnormality. No discrete lytic or blastic osseous lesions. Other neck: No other acute soft tissue abnormality  within the neck. Subcentimeter hypodense left thyroid nodule noted, of doubtful significance. Upper chest: Visualized upper chest demonstrates no acute finding. Partially visualized lungs are grossly clear. Review of the MIP images confirms the above findings CTA HEAD FINDINGS Anterior circulation: Internal carotid arteries patent to the termini without stenosis. A1 segments, anterior communicating artery common anterior cerebral arteries widely patent. M1 segments widely patent. Distal MCA branches well perfused and symmetric. Posterior circulation: Vertebral arteries widely patent to the vertebrobasilar junction without stenosis. Posterior inferior cerebral arteries patent bilaterally. Basilar artery widely patent to its distal aspect without abnormality. Superior cerebellar and posterior cerebral arteries widely patent bilaterally. Small left posterior communicating artery noted. Venous sinuses: Patent. Anatomic variants: None significant.  No intracranial aneurysm. Delayed phase: No abnormal enhancement. Review of the MIP images confirms the above findings IMPRESSION: 1. 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 2. Otherwise normal CTA of the head and neck. Critical Value/emergent results were called by telephone at the time  of interpretation on 05/20/2018 at 10:11 pm to the emergency room PA Lenn Sink , who verbally acknowledged these results. Electronically Signed   By: Jeannine Boga M.D.   On: 05/20/2018 22:13   Mr Cervical Spine Wo Contrast  Addendum Date: 05/20/2018   ADDENDUM REPORT: 05/20/2018 23:23 ADDENDUM: Findings were discussed by telephone with Dr. Amil Amen at approximately 7:15 p.m. on 05/20/2018. Patient to be instructed to go to the ER for further workup and evaluation. Electronically Signed   By: Jeannine Boga M.D.   On: 05/20/2018 23:23   Result Date: 05/20/2018 CLINICAL DATA:  Initial evaluation  for acute neck pain and stiffness for 9 days, worse on the right. EXAM: MRI CERVICAL SPINE WITHOUT CONTRAST TECHNIQUE: Multiplanar, multisequence MR imaging of the cervical spine was performed. No intravenous contrast was administered. COMPARISON:  Prior head CT from 05/09/2009 FINDINGS: Alignment: Straightening of the normal cervical lordosis. No listhesis. Vertebrae: Vertebral body heights well maintained without evidence for acute or chronic fracture. Bone marrow signal intensity within normal limits. No discrete or worrisome osseous lesions. No abnormal marrow edema. Cord: Signal intensity within the cervical spinal cord is normal. Normal cord caliber and morphology. Posterior Fossa, vertebral arteries, paraspinal tissues: Benign arachnoid cyst at the right CP angle cistern partially visualize, grossly similar from previous. Visualized brain and posterior fossa otherwise unremarkable. Craniocervical junction within normal limits. Paraspinous and prevertebral soft tissues within normal limits. Irregular flow void within the right vertebral artery (series 5, image 7), of uncertain significance, but raises the possibility for acute vascular abnormality/dissection. Normal intravascular flow voids present within the left vertebral artery. Disc levels: No significant disc pathology within the cervical spine. Intervertebral discs well hydrated with preserved disc height. No disc bulge or disc protrusion. No significant facet disease. No canal or foraminal stenosis. IMPRESSION: 1. Irregular flow void within the right vertebral artery, of uncertain chronicity or significance, but could reflect an acute vascular injury/dissection in the setting of acute neck pain. Further evaluation with CTA recommended. 2. Otherwise unremarkable MRI of the cervical spine. No other acute abnormality identified. Attempt is currently being made to contact the ordering clinician. Results will be conveyed as soon as possible. Electronically  Signed: By: Jeannine Boga M.D. On: 05/20/2018 18:50    EKG: Independently reviewed.  ST or T wave abnormalities, normal intervals.  No old EKG to compare  Assessment/Plan Principal Problem:   Vertebral artery dissection (HCC) Active Problems:   Rheumatoid arthritis (HCC)   Depression, recurrent ([place])  Vertebral artery dissection-suggested on MRI, subsequent CTA- CTA head and neck -acute dissection involving right vertebral artery, right V1 and V2 segments with moderate to severe diffuse stenosis.  Patients with right-sided strength deficits.  Rheumatoid arthritis.  -Neurology recommends stroke work-up, MRI, connective tissue disease workup, doubt need for hypercoagulable work-up. -Echocardiogram -MRI -PT OT speech evaluation -Hemoglobin A1c lipid panel -Patient's requesting nothing stronger than hydrocodone for pain control, her hallucinations in the past with hydrocodone. -Tramadol increase dose to 100 mg every 6 hourly for pain -Lipitor 80 daily -Continue aspirin 325 mg daily for now -Passed Swallow eval -Patient already has a diagnosis of rheumatoid arthritis, ? Need for ESR, CRP, HLA- B 27. Will check ANA, other connective tissue disease work-up, and further evaluation for marfans , per Neurology recs  Rheumatoid arthritis-joints appear stable. -Continue home methotrexate once weekly, not due for next dose Humira  Depression-continue home Celexa  HIV as part of routine health screening   DVT prophylaxis: Scds for now , pending  brain MRI. Code Status: Full Family Communication: Spouse and ,mom at bedside Disposition Plan: per rounding team Consults called: Neurology Admission status: obs, tele   Bethena Roys MD [facility] Pager [phone] From 6PM-2AM.  Otherwise please contact night-coverage [email or web address] Password [email or web address]  05/21/2018, 1:14 AM

## 2018-05-21 NOTE — Progress Notes (Signed)
STROKE TEAM PROGRESS NOTE   HISTORY OF PRESENT ILLNESS (per record) Sophia Costa is a 37 y.o. female past medical history of rheumatoid arthritis currently on Humira and methotrexate, with some right-sided pain and weakness due to arthritis, presenting to the emergency room after imaging studies showed concern for right vertebral artery dissection.  Patient says she was in her usual state of health about a week or so ago when she started noticing some headache and neck pain.  This all started after an event of violent vomiting/coughing, which they thought was due to a stomach bug.  Right after the violent coughing/vomiting, she started developing some headache in the back of her neck and head that would not respond to NSAIDs.  She also started having some numbness and tingling in her right arm at that time.  She went to her rheumatologist and her primary care who recommended that she get a C-spine MRI given her history of rheumatoid arthritis.  The C-spine MRI did not reveal any cervical spine issues but was concerning for a right vertebral dissection and a CT angiogram of the head and neck was recommended.  She had a CT Angie Graham head and neck, that revealed a right vertebral artery dissection V1 V2 level.  Neurological consultation was placed for the further management.  She denies any visual symptoms.  Denies any chest pain.  She does have some nausea.  She had tingling and weakness in the right hand more than leg but she describes some weakness on the right side being baseline because of her rheumatoid arthritis having been asymmetrically affecting the right side more.  Patient does have a family history of multiple family members were double-jointed and are being worked up for connective tissue disorder.  Her maternal grandmother had an aneurysm in the brain.      SUBJECTIVE (INTERVAL HISTORY) Her family is at the bedside.  She has neck pain but otherwise no focal deficits. Discussed  vertebral dissection due to trauma, precautions in the future, answered all questions. She has RA and a FHx of connective tissue disease, needs follow up with rheumatology for workup of disorders than can cause increase risk of vascular disorders such as dissections.    OBJECTIVE Temp:  [97.9 F (36.6 C)-98.6 F (37 C)] 98.4 F (36.9 C) (06/28 0614) Pulse Rate:  [80-108] 81 (06/28 0614) Cardiac Rhythm: Normal sinus rhythm (06/28 0216) Resp:  [9-20] 20 (06/28 0614) BP: (97-157)/(55-94) 99/68 (06/28 0614) SpO2:  [95 %-100 %] 99 % (06/28 0614) Weight:  [180 lb (81.6 kg)-185 lb 13.6 oz (84.3 kg)] 185 lb 13.6 oz (84.3 kg) (06/28 0212)  CBC:  Recent Labs  Lab 05/20/18 2028 05/20/18 2251  WBC  --  9.0  NEUTROABS  --  4.1  HGB 15.3* 13.7  HCT 45.0 40.3  MCV  --  92.2  PLT  --  382    Basic Metabolic Panel:  Recent Labs  Lab 05/20/18 2028 05/20/18 2251  NA 139 138  K 3.6 3.7  CL 104 106  CO2  --  24  GLUCOSE 125* 112*  BUN 16 14  CREATININE 0.90 0.94  CALCIUM  --  9.2    Lipid Panel:     Component Value Date/Time   CHOL 142 05/21/2018 0418   TRIG 86 05/21/2018 0418   HDL 60 05/21/2018 0418   CHOLHDL 2.4 05/21/2018 0418   VLDL 17 05/21/2018 0418   LDLCALC 65 05/21/2018 0418   HgbA1c:  Lab Results  Component  Value Date   HGBA1C 5.0 05/21/2018   Urine Drug Screen:     Component Value Date/Time   LABOPIA NONE DETECTED 09/23/2007 2001   COCAINSCRNUR NONE DETECTED 09/23/2007 2001   [place] NONE DETECTED 09/23/2007 2001   AMPHETMU NONE DETECTED 09/23/2007 2001   THCU NONE DETECTED 09/23/2007 2001   LABBARB  09/23/2007 2001    NONE DETECTED        DRUG SCREEN FOR MEDICAL PURPOSES ONLY.  IF CONFIRMATION IS NEEDED FOR ANY PURPOSE, NOTIFY LAB WITHIN 5 DAYS.    Alcohol Level     Component Value Date/Time   [facility]  09/23/2007 1940    <5        LOWEST DETECTABLE LIMIT FOR SERUM ALCOHOL IS 11 mg/dL FOR MEDICAL PURPOSES ONLY    IMAGING   Ct Angio Head W Or  Wo Contrast  Result Date: 05/20/2018 CLINICAL DATA:  Initial evaluation for possible right vertebral artery abnormality. EXAM: CT ANGIOGRAPHY HEAD AND NECK TECHNIQUE: Multidetector CT imaging of the head and neck was performed using the standard protocol during bolus administration of intravenous contrast. Multiplanar CT image reconstructions and MIPs were obtained to evaluate the vascular anatomy. Carotid stenosis measurements (when applicable) are obtained utilizing NASCET criteria, using the distal internal carotid diameter as the denominator. CONTRAST:  65mL ISOVUE-370 IOPAMIDOL (ISOVUE-370) INJECTION 76% COMPARISON:  Prior MRI of the cervical spine from earlier the same day. FINDINGS: CT HEAD FINDINGS Brain: Cerebral volume within normal limits. No acute intracranial hemorrhage. No acute large vessel territory infarct. No mass lesion, midline shift or mass effect. No hydrocephalus. No arachnoid cyst at the right CP angle cistern noted, stable. No other extra-axial fluid collection. Vascular: No hyperdense vessel. Skull: Scalp soft tissues and calvarium within normal limits. Sinuses: Paranasal sinuses and mastoid air cells are clear. Orbits: Globes and orbital soft tissues within normal limits. Review of the MIP images confirms the above findings CTA NECK FINDINGS Aortic arch: Visualized aortic arch of normal caliber with normal branch pattern. No flow-limiting stenosis about the origin of the great vessels. Visualized subclavian arteries widely patent. Right carotid system: Right common and internal carotid arteries widely patent without stenosis, dissection, or occlusion. No atheromatous narrowing about the right carotid bifurcation. Left carotid system: Left common and internal carotid arteries widely patent without stenosis, dissection, or occlusion. No atheromatous narrowing about the left carotid bifurcation. Vertebral arteries: Both of the vertebral arteries arise from the subclavian arteries. Left  vertebral artery dominant and widely patent to the skull base without stenosis or abnormality. Right vertebral artery is diffusely irregular in appearance with multifocal stenoses and irregularity involving the V1 and V2 segments, consistent with dissection. There is near occlusive stenosis at the proximal V2 segment (series 12, image 239). No appreciable raise flap or irregular intraluminal thrombus. Dissection essentially involves the entirety of the V2 segment as well as the V2/V3 junction. V3 segment more normal distally, and the vertebral artery is essentially normal in appearance as it reaches the cranial vault. Skeleton: No acute osseous abnormality. No discrete lytic or blastic osseous lesions. Other neck: No other acute soft tissue abnormality within the neck. Subcentimeter hypodense left thyroid nodule noted, of doubtful significance. Upper chest: Visualized upper chest demonstrates no acute finding. Partially visualized lungs are grossly clear. Review of the MIP images confirms the above findings CTA HEAD FINDINGS Anterior circulation: Internal carotid arteries patent to the termini without stenosis. A1 segments, anterior communicating artery common anterior cerebral arteries widely patent. M1 segments widely patent. Distal MCA  branches well perfused and symmetric. Posterior circulation: Vertebral arteries widely patent to the vertebrobasilar junction without stenosis. Posterior inferior cerebral arteries patent bilaterally. Basilar artery widely patent to its distal aspect without abnormality. Superior cerebellar and posterior cerebral arteries widely patent bilaterally. Small left posterior communicating artery noted. Venous sinuses: Patent. Anatomic variants: None significant.  No intracranial aneurysm. Delayed phase: No abnormal enhancement. Review of the MIP images confirms the above findings IMPRESSION: 1. Acute dissection involving the right vertebral artery, right V1 and V2 segments with moderate  to severe diffuse stenosis. 2. Otherwise normal CTA of the head and neck. Critical Value/emergent results were called by telephone at the time of interpretation on 05/20/2018 at 10:11 pm to the emergency room PA Lenn Sink , who verbally acknowledged these results. Electronically Signed   By: Jeannine Boga M.D.   On: 05/20/2018 22:13   Ct Angio Neck W And/or Wo Contrast  Result Date: 05/20/2018 CLINICAL DATA:  Initial evaluation for possible right vertebral artery abnormality. EXAM: CT ANGIOGRAPHY HEAD AND NECK TECHNIQUE: Multidetector CT imaging of the head and neck was performed using the standard protocol during bolus administration of intravenous contrast. Multiplanar CT image reconstructions and MIPs were obtained to evaluate the vascular anatomy. Carotid stenosis measurements (when applicable) are obtained utilizing NASCET criteria, using the distal internal carotid diameter as the denominator. CONTRAST:  57mL ISOVUE-370 IOPAMIDOL (ISOVUE-370) INJECTION 76% COMPARISON:  Prior MRI of the cervical spine from earlier the same day. FINDINGS: CT HEAD FINDINGS Brain: Cerebral volume within normal limits. No acute intracranial hemorrhage. No acute large vessel territory infarct. No mass lesion, midline shift or mass effect. No hydrocephalus. No arachnoid cyst at the right CP angle cistern noted, stable. No other extra-axial fluid collection. Vascular: No hyperdense vessel. Skull: Scalp soft tissues and calvarium within normal limits. Sinuses: Paranasal sinuses and mastoid air cells are clear. Orbits: Globes and orbital soft tissues within normal limits. Review of the MIP images confirms the above findings CTA NECK FINDINGS Aortic arch: Visualized aortic arch of normal caliber with normal branch pattern. No flow-limiting stenosis about the origin of the great vessels. Visualized subclavian arteries widely patent. Right carotid system: Right common and internal carotid arteries widely patent without  stenosis, dissection, or occlusion. No atheromatous narrowing about the right carotid bifurcation. Left carotid system: Left common and internal carotid arteries widely patent without stenosis, dissection, or occlusion. No atheromatous narrowing about the left carotid bifurcation. Vertebral arteries: Both of the vertebral arteries arise from the subclavian arteries. Left vertebral artery dominant and widely patent to the skull base without stenosis or abnormality. Right vertebral artery is diffusely irregular in appearance with multifocal stenoses and irregularity involving the V1 and V2 segments, consistent with dissection. There is near occlusive stenosis at the proximal V2 segment (series 12, image 239). No appreciable raise flap or irregular intraluminal thrombus. Dissection essentially involves the entirety of the V2 segment as well as the V2/V3 junction. V3 segment more normal distally, and the vertebral artery is essentially normal in appearance as it reaches the cranial vault. Skeleton: No acute osseous abnormality. No discrete lytic or blastic osseous lesions. Other neck: No other acute soft tissue abnormality within the neck. Subcentimeter hypodense left thyroid nodule noted, of doubtful significance. Upper chest: Visualized upper chest demonstrates no acute finding. Partially visualized lungs are grossly clear. Review of the MIP images confirms the above findings CTA HEAD FINDINGS Anterior circulation: Internal carotid arteries patent to the termini without stenosis. A1 segments, anterior communicating artery common anterior cerebral  arteries widely patent. M1 segments widely patent. Distal MCA branches well perfused and symmetric. Posterior circulation: Vertebral arteries widely patent to the vertebrobasilar junction without stenosis. Posterior inferior cerebral arteries patent bilaterally. Basilar artery widely patent to its distal aspect without abnormality. Superior cerebellar and posterior cerebral  arteries widely patent bilaterally. Small left posterior communicating artery noted. Venous sinuses: Patent. Anatomic variants: None significant.  No intracranial aneurysm. Delayed phase: No abnormal enhancement. Review of the MIP images confirms the above findings IMPRESSION: 1. 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 2. Otherwise normal CTA of the head and neck. Critical Value/emergent results were called by telephone at the time of interpretation on 05/20/2018 at 10:11 pm to the emergency room PA Lenn Sink , who verbally acknowledged these results. Electronically Signed   By: Jeannine Boga M.D.   On: 05/20/2018 22:13   Mr Cervical Spine Wo Contrast  Addendum Date: 05/20/2018   ADDENDUM REPORT: 05/20/2018 23:23 ADDENDUM: Findings were discussed by telephone with Dr. Amil Amen at approximately 7:15 p.m. on 05/20/2018. Patient to be instructed to go to the ER for further workup and evaluation. Electronically Signed   By: Jeannine Boga M.D.   On: 05/20/2018 23:23   Result Date: 05/20/2018 CLINICAL DATA:  Initial evaluation for acute neck pain and stiffness for 9 days, worse on the right. EXAM: MRI CERVICAL SPINE WITHOUT CONTRAST TECHNIQUE: Multiplanar, multisequence MR imaging of the cervical spine was performed. No intravenous contrast was administered. COMPARISON:  Prior head CT from 05/09/2009 FINDINGS: Alignment: Straightening of the normal cervical lordosis. No listhesis. Vertebrae: Vertebral body heights well maintained without evidence for acute or chronic fracture. Bone marrow signal intensity within normal limits. No discrete or worrisome osseous lesions. No abnormal marrow edema. Cord: Signal intensity within the cervical spinal cord is normal. Normal cord caliber and morphology. Posterior Fossa, vertebral arteries, paraspinal tissues: Benign arachnoid cyst at the right CP angle cistern partially visualize,  grossly similar from previous. Visualized brain and posterior fossa otherwise unremarkable. Craniocervical junction within normal limits. Paraspinous and prevertebral soft tissues within normal limits. Irregular flow void within the right vertebral artery (series 5, image 7), of uncertain significance, but raises the possibility for acute vascular abnormality/dissection. Normal intravascular flow voids present within the left vertebral artery. Disc levels: No significant disc pathology within the cervical spine. Intervertebral discs well hydrated with preserved disc height. No disc bulge or disc protrusion. No significant facet disease. No canal or foraminal stenosis. IMPRESSION: 1. Irregular flow void within the right vertebral artery, of uncertain chronicity or significance, but could reflect an acute vascular injury/dissection in the setting of acute neck pain. Further evaluation with CTA recommended. 2. Otherwise unremarkable MRI of the cervical spine. No other acute abnormality identified. Attempt is currently being made to contact the ordering clinician. Results will be conveyed as soon as possible. Electronically Signed: By: Jeannine Boga M.D. On: 05/20/2018 18:50     Transthoracic Echocardiogram - pending 00/00/00    Bilateral Carotid Dopplers - pending 00/00/00     PHYSICAL EXAM Vitals:   05/21/18 0145 05/21/18 0212 05/21/18 0414 05/21/18 0614  BP: 108/73 120/74 (!) 97/55 99/68  Pulse: 82 83 80 81  Resp: 15 20 20 20   Temp:  98.6 F (37 C) 98.2 F (36.8 C) 98.4 F (36.9 C)  TempSrc:  Oral Oral Oral  SpO2: 95% 100% 98% 99%  Weight:  185 lb 13.6 oz (84.3 kg)    Height:  5\' 4"  (1.626 m)      General -  Heart - Regular rate and rhythm - no murmer appreciated Lungs - Clear to auscultation anteriorly Abdomen - Soft - non tender Extremities - Distal pulses intact - no edema Skin - Warm and dry  Mental Status: Alert, oriented, thought content appropriate.  Speech fluent  without evidence of aphasia.  Able to follow 3 step commands without difficulty. Cranial Nerves: II: Discs not visualized; Visual fields grossly normal, pupils equal, round, reactive to light. III,IV, VI: ptosis not present, extra-ocular motions intact bilaterally V,VII: smile symmetric, facial light touch sensation normal bilaterally VIII: hearing normal bilaterally IX,X: gag reflex present XI: bilateral shoulder shrug intact. XII: midline tongue extension Motor: RUE - 5/5    LUE - 5/5 RLE - 5/5    LLE -  5/5 Tone and bulk:normal tone throughout; no atrophy noted Sensory: Light touch intact throughout, bilaterally Deep Tendon Reflexes: 2+ and symmetric throughout Plantars: Right: downgoing   Left: downgoing Cerebellar: normal finger-to-nose, normal rapid alternating movements and normal heel-to-shin test Gait: not tested      HOME MEDICATIONS:  Medications Prior to Admission  Medication Sig Dispense Refill  . Adalimumab (HUMIRA) 40 MG/0.4ML PSKT Inject 40 mg into the skin every 14 (fourteen) days.    . calcium citrate-vitamin D (CITRACAL+D) 315-200 MG-UNIT per tablet Take 2 tablets by mouth daily.    . cetirizine (ZYRTEC) 10 MG tablet Take 10 mg by mouth at bedtime.     . Cholecalciferol (VITAMIN D3) 1000 UNITS CAPS Take 1,000 Units by mouth daily.     . citalopram (CELEXA) 20 MG tablet Take 1 tablet (20 mg total) by mouth daily. (Patient taking differently: Take 20 mg by mouth at bedtime. ) 90 tablet 3  . colestipol (COLESTID) 1 g tablet Take 2 g by mouth daily.  3  . fish oil-omega-3 fatty acids 1000 MG capsule Take 2 g by mouth daily.    . folic acid (FOLVITE) 1 MG tablet Take 2 mg by mouth at bedtime.     Marland Kitchen glucosamine-chondroitin 500-400 MG tablet Take 1 tablet by mouth daily.     Marland Kitchen ibuprofen (ADVIL,MOTRIN) 200 MG tablet Take 800 mg by mouth every 4 (four) hours as needed (for pain).    . methotrexate 50 MG/2ML injection Inject 10 mg into the skin See admin instructions.  Inject 10 mg once a week every Friday at bedtime    . predniSONE (DELTASONE) 10 MG tablet Take 10 mg by mouth 2 (two) times daily as needed (for R.A. flares).    Marland Kitchen scopolamine (TRANSDERM-SCOP) 1 MG/3DAYS 1 patch behind ear every 72 hours PRN to prevent motion sickness. 10 patch 1  . traMADol (ULTRAM) 50 MG tablet Take 50 mg by mouth every 6 (six) hours as needed (for pain).    Marland Kitchen azelastine (ASTELIN) 137 MCG/SPRAY nasal spray Place 2 sprays into the nose 2 (two) times daily. Use in each nostril as directed (Patient not taking: Reported on 05/20/2018) 30 mL 12  . ondansetron (ZOFRAN ODT) 8 MG disintegrating tablet Take 1 tablet (8 mg total) by mouth every 8 (eight) hours as needed for nausea or vomiting. (Patient not taking: Reported on 05/20/2018) 20 tablet 0      HOSPITAL MEDICATIONS:  . aspirin  325 mg Oral Daily  . atorvastatin  80 mg Oral q1800  . citalopram  20 mg Oral QHS  . colestipol  2 g Oral Daily  . folic acid  2 mg Oral  QHS  . iopamidol        ALLERGIES Allergies  Allergen Reactions  . Prednisone Shortness Of Breath    Reacted to a dose greater than 10 mg (1st day of a taper pack)    PAST MEDICAL HISTORY Past Medical History:  Diagnosis Date  . Allergy   . Chronic kidney disease    microscopic hematuria  . Rheumatoid arthritis(714.0) 7/12    SURGICAL HISTORY Past Surgical History:  Procedure Laterality Date  . CHOLECYSTECTOMY      FAMILY HISTORY Family History  Problem Relation Age of Onset  . Arthritis Mother   . Hypertension Mother   . Kidney disease Father   . Kidney disease Sister        IgA nephropathy  . Arthritis Paternal Grandmother   . Heart disease Paternal Grandfather   . Diabetes Paternal Grandfather     SOCIAL HISTORY  reports that she has never smoked. She has never used smokeless tobacco. She reports that she drinks alcohol. She reports that she does not use drugs.   PHYSICAL EXAM Physical exam: Exam: Gen: NAD Eyes: anicteric  sclerae, moist conjunctivae                    CV: no MRG, no carotid bruits, no peripheral edema Mental Status: Alert, follows commands, good historian  Neuro: Detailed Neurologic Exam  Speech:    No aphasia, no dysarthria  Cranial Nerves:    The pupils are equal, round, and reactive to light.. Attempted, Fundi not visualized.  EOMI. No gaze preference. Visual fields full. Face symmetric, Tongue midline. Hearing intact to voice. Shoulder shrug intact  Motor Observation:    no involuntary movements noted. Tone appears normal.     Strength:    5/5 with some very minimal giveway due to pain on the right UE proximally     Sensation:  Intact to LT  Plantars equiv .    ASSESSMENT/PLAN Sophia Costa is a 37 y.o. female with history of RA presenting with Vertebral dissection in the setting of vomiting.   Vertebral Dissection no stroke  Resultant  Neck pain, no focal neuro deficits  CT head normal  MRI head normal  CTA H/N: Acute dissection involving the right vertebral artery, right V1  and V2 segments   LDL 92  HgbA1c - 5.0 Diet Order           Diet regular Room service appropriate? Yes; Fluid consistency: Thin  Diet effective now           Recommend ASA 325mg  for 3-6 months. Follow up with Janett Billow at [facility] in 8 weeks, repeat CTA of the neck to be ordered at followup in 8 weeks  Patient counseled to be compliant with her antithrombotic medications  Ongoing aggressive stroke risk factor management  Therapy recommendations:  pending  Disposition:  Pending  Other Stroke Risk Factors  RA, FHx of connective tissue disease, follow up with Rheumatology  Plan / Recommendations   Recommend ASA 325mg  for 3-6 months. Follow up with Janett Billow at [facility] in 8 weeks, repeat CTA of the neck to follow.  MD Follow Up: [facility] in 8 weeks  Other: pending  Further risk factor modification per primary care MD: Follow Up 2 weeks  Stroke  team will sign off at this time  Hospital day # 0  Personally examined patient and images, and have participated in and made any corrections needed to history, physical, neuro exam,assessment and  plan as stated above.  I have personally obtained the history, evaluated lab date, reviewed imaging studies and agree with radiology interpretations.    Sarina Ill, MD Stroke Neurology   To contact Stroke Continuity provider, please refer to [email or web address] After hours, contact General Neurology

## 2018-05-21 NOTE — Evaluation (Signed)
Occupational Therapy Evaluation Patient Details Name: Sophia Costa MRN: [id number] DOB: [birth year] Today's Date: 05/21/2018    History of Present Illness Patient is a 37 y/o female presenting to the ED on 05/20/18 with complaints of neck pain and R UE numbness and tingling. CTA head and neck revealing an acute dissection involving right vertebral artery. Patient with a PMH significant for RA, depression.    Clinical Impression   Pt currently performing self care and functional mobility with min A - min guard secondary to neck pain. OT demonstrated recommendations to compensate for neck pain and dress self independently. Pt and caregiver reporting no further concerns at this time. Pt does not need further OT intervention. OT to sign off. Thank you for referral and please re consult if any further need arises.    Follow Up Recommendations  Supervision - Intermittent;No OT follow up    Equipment Recommendations  None recommended by OT    Recommendations for Other Services Other (comment)(none known at this time)     Precautions / Restrictions Precautions Precautions: Fall Restrictions Weight Bearing Restrictions: No      Mobility Bed Mobility Overal bed mobility: Needs Assistance Bed Mobility: Supine to Sit     Supine to sit: HOB elevated;Min assist     General bed mobility comments: to prevent neck motion, not necessarily due to weakness/inability  Transfers Overall transfer level: Needs assistance Equipment used: 1 person hand held assist Transfers: Sit to/from Stand Sit to Stand: Min guard         General transfer comment: with patient preventing any motion at C-spine    Balance Overall balance assessment: Modified Independent            ADL either performed or assessed with clinical judgement   ADL Overall ADL's : Needs assistance/impaired        General ADL Comments: Pt requesting husband's assistance with dressing secondary to neck pain with  movement. OT suggesting and demonstrating figure four position while seated to thread clothing safely for pt to dress self.     Vision Baseline Vision/History: No visual deficits              Pertinent Vitals/Pain Pain Assessment: Faces Faces Pain Scale: Hurts little more Pain Location: neck - with movement Pain Descriptors / Indicators: Aching;Discomfort;Sore Pain Intervention(s): Limited activity within patient's tolerance;Monitored during session     Hand Dominance Right   Extremity/Trunk Assessment Upper Extremity Assessment Upper Extremity Assessment: Overall WFL for tasks assessed   Lower Extremity Assessment Lower Extremity Assessment: Overall WFL for tasks assessed   Cervical / Trunk Assessment Cervical / Trunk Assessment: Normal   Communication Communication Communication: No difficulties   Cognition Arousal/Alertness: Awake/alert Behavior During Therapy: WFL for tasks assessed/performed Overall Cognitive Status: Within Functional Limits for tasks assessed                    Home Living Family/patient expects to be discharged to:: Private residence Living Arrangements: Spouse/significant other Available Help at Discharge: Family Type of Home: Apartment Home Access: Stairs to enter Technical brewer of Steps: 1 Entrance Stairs-Rails: None Home Layout: One level     Bathroom Shower/Tub: Astronomer Accessibility: Yes How Accessible: Accessible via walker Home Equipment: Shower seat          Prior Functioning/Environment Level of Independence: Independent        Comments: works full time  OT Goals(Current goals can be found in the care plan section) Acute Rehab OT Goals Patient Stated Goal: return home and to work OT Goal Formulation: With patient/family Time For Goal Achievement: 06/04/18 Potential to Achieve Goals: Good  OT Frequency:     Barriers to D/C: Other (comment)  none known at  this time          AM-PAC PT "6 Clicks" Daily Activity     Outcome Measure Help from another person eating meals?: None Help from another person taking care of personal grooming?: None Help from another person toileting, which includes using toliet, bedpan, or urinal?: A Little Help from another person bathing (including washing, rinsing, drying)?: A Little Help from another person to put on and taking off regular upper body clothing?: None Help from another person to put on and taking off regular lower body clothing?: A Little 6 Click Score: 21   End of Session    Activity Tolerance: Patient limited by pain Patient left: in bed;with call bell/phone within reach;with bed alarm set;with family/visitor present  OT Visit Diagnosis: Pain Pain - part of body: (neck)                Time: 1150-1200 OT Time Calculation (min): 10 min Charges:  OT General Charges $OT Visit: 1 Visit OT Evaluation $OT Eval Low Complexity: 1 Low G-Codes:      Rashaunda Rahl P, MS, OTR/L 05/21/2018, 12:23 PM

## 2018-05-24 LAB — FANA STAINING PATTERNS: Homogeneous Pattern: 1:80 {titer}

## 2018-05-24 LAB — ANTINUCLEAR ANTIBODIES, IFA: ANA Ab, IFA: POSITIVE — AB

## 2018-05-25 ENCOUNTER — Telehealth: Payer: Self-pay | Admitting: Family Medicine

## 2018-05-25 NOTE — Telephone Encounter (Addendum)
PCP: Eulas Post, MD  Admit date: 05/20/2018 Discharge date:    Time spent: < 25 minutes  Admitted From: Home Disposition:  Home  Recommendations for Outpatient Follow-up:   Follow up [facility] in 8 weeks for repeat CTA of the neck to follow  Aspirin 325 mg 3-6 months  Follow up with PCP in 2 weeks  Follow up with [place]: NO Equipment/Devices: none Discharge Diagnoses:      Active Hospital Problems   Diagnosis Date Noted  . Vertebral artery dissection ([place]) 05/21/2018  . Depression, recurrent ([place]) 12/18/2013  . Rheumatoid arthritis ([place]) 07/02/2012    Resolved Hospital Problems  No resolved problems to display.    Discharge Condition: Stable  CODE STATUS: FULL Diet recommendation: Heart Healthy     Transition Care Management Follow-up Telephone Call   Date discharged? 05/21/18   How have you been since you were released from the hospital? "Still having pain in my neck"   Do you understand why you were in the hospital? yes, "I was seen for neck pain and artery dissection. I am still having pain. I have reached out to [facility] to schedule an appt for my Hospitalization Follow Up but have not heard back yet."    Do you understand the discharge instructions? yes, waiting on call back from [facility] for Hospital Follow up. Pt aware that she will need to have a CTa done and then possible stent based on results of th CTa.    Where were you discharged to? Home   Items Reviewed:  Medications reviewed: yes, taking ASA 325mg  as directed. Had to stop the Tramadol d/t side effects -nausea  Allergies reviewed: yes  Dietary changes reviewed: no  Referrals reviewed: yes   Functional Questionnaire:   Activities of Daily Living (ADLs):   She states they are independent in the following: ambulation, bathing and hygiene, feeding, continence, grooming, toileting and dressing , Pt is not working due  to severe pain in neck. Had to stop Using Tramadol PRN for pain d/t side effect (nausea) - Using Tylenol for pain.    States they require assistance with the following: n/a   Any transportation issues/concerns?: yes, patient is not comfortable driving as she still cannot turn her head well. Pt is going to be brought to her appt by her husband.   Any patient concerns? yes, Still having severe pain in neck and not able to take the Tramadol d/t side effects so she is using Tylenol which seems to do "okay" for pain.   Confirmed importance and date/time of follow-up visits scheduled yes  Provider Appointment booked with Dr Elease Hashimoto 05/28/18 at 1:15p  Confirmed with patient if condition begins to worsen call PCP or go to the ER.  Patient was given the office number and encouraged to call back with question or concerns.  : yes

## 2018-05-28 ENCOUNTER — Encounter: Payer: Self-pay | Admitting: Family Medicine

## 2018-05-28 ENCOUNTER — Ambulatory Visit: Payer: 59 | Admitting: Family Medicine

## 2018-05-28 VITALS — BP 120/62 | HR 100 | Temp 98.3°F | Wt 181.3 lb

## 2018-05-28 DIAGNOSIS — M069 Rheumatoid arthritis, unspecified: Secondary | ICD-10-CM | POA: Diagnosis not present

## 2018-05-28 DIAGNOSIS — I7774 Dissection of vertebral artery: Secondary | ICD-10-CM

## 2018-05-28 NOTE — Progress Notes (Signed)
Subjective:     Patient ID: Sophia Costa, female   DOB: 1981-09-18, 37 y.o.   MRN: [id number]  HPI Patient seen for hospital follow-up. She has history of rheumatoid arthritis which is followed by rheumatology. She had recently by her rheumatologist who ordered outpatient MRI of the neck for some progressively worsening right-sided neck pain. This came back concerning for possible vertebral artery abnormality. She had recent episode of some vomiting attributed to gastroenteritis. Patient was promptly referred to the ED for further evaluation. She had CT angiogram which confirmed vertebral artery dissection. She had consultation with neurology who recommended full dose aspirin but no anticoagulation. She had MRI the brain which ruled out stroke.  Patient had question of some right-sided weakness in her right hand. MRI as above did not confirm any stroke though. She does not have any known history of connective tissue disorder. She apparently had a maternal grandmother that died of cerebral aneurysm. No known family history of Ehlers-Danlos syndrome.  She continues to have some neck pain. She's tried Tylenol without much relief. She had also been on some tramadol which provided only minimal relief. She states her pain is usually about 4 out of 10 regarding the neck pain. No speech changes. No visual changes. No ataxia. No swallowing difficulties.  She has scheduled follow-up with neurology but this is not until August 27 with nurse practitioner. She was hoping to get in to see a physician sooner.  Past Medical History:  Diagnosis Date  . Allergy   . Chronic kidney disease    microscopic hematuria  . Rheumatoid arthritis(714.0) 7/12   Past Surgical History:  Procedure Laterality Date  . CHOLECYSTECTOMY      reports that she has never smoked. She has never used smokeless tobacco. She reports that she drinks alcohol. She reports that she does not use drugs. family history includes Arthritis  in her mother and paternal grandmother; Diabetes in her paternal grandfather; Heart disease in her paternal grandfather; Hypertension in her mother; Kidney disease in her father and sister. Allergies  Allergen Reactions  . Prednisone Shortness Of Breath    Reacted to a dose greater than 10 mg (1st day of a taper pack)  . Tramadol Nausea Only     Review of Systems  Constitutional: Negative for chills and fever.  Eyes: Negative for visual disturbance.  Respiratory: Negative for shortness of breath.   Cardiovascular: Negative for chest pain.  Neurological: Positive for headaches. Negative for dizziness, seizures, syncope, facial asymmetry and speech difficulty.  Psychiatric/Behavioral: Negative for confusion.       Objective:   Physical Exam  Constitutional: She is oriented to person, place, and time. She appears well-developed and well-nourished.  Cardiovascular: Normal rate and regular rhythm.  Pulmonary/Chest: Effort normal and breath sounds normal. She has no rales.  Musculoskeletal: She exhibits no edema.  Neurological: She is alert and oriented to person, place, and time. No cranial nerve deficit. Coordination normal.  Psychiatric: She has a normal mood and affect. Her behavior is normal.       Assessment:     #1 recent vertebral artery dissection. No known connective tissue disorder.  #2 rheumatoid arthritis followed by rheumatology and treated with methotrexate and Humira      Plan:     -Patient's main concern is whether she can get in to see neurology sooner than late August. We will call to see what can be done there -Continue aspirin 325 mg daily -She knows to avoid any heavy  lifting or any activities involving extreme neck flexion or extension. -Follow-up immediately for any focal weakness, speech changes, slurred speech, etc.  Eulas Post MD [facility]

## 2018-05-28 NOTE — Patient Instructions (Signed)
We will call to see if we can get earlier Neurology follow up.    Continue with the Aspirin 325 mg once daily  Follow up immediately for any slurred speech, focal, weakness, or any other concerns.

## 2018-06-03 ENCOUNTER — Emergency Department (HOSPITAL_BASED_OUTPATIENT_CLINIC_OR_DEPARTMENT_OTHER): Payer: 59

## 2018-06-03 ENCOUNTER — Encounter (HOSPITAL_BASED_OUTPATIENT_CLINIC_OR_DEPARTMENT_OTHER): Payer: Self-pay | Admitting: Emergency Medicine

## 2018-06-03 ENCOUNTER — Emergency Department (HOSPITAL_BASED_OUTPATIENT_CLINIC_OR_DEPARTMENT_OTHER)
Admission: EM | Admit: 2018-06-03 | Discharge: 2018-06-03 | Disposition: A | Discharge status: Home / Self Care | Payer: 59 | Attending: Emergency Medicine | Admitting: Emergency Medicine

## 2018-06-03 ENCOUNTER — Other Ambulatory Visit: Payer: Self-pay

## 2018-06-03 DIAGNOSIS — Z79899 Other long term (current) drug therapy: Secondary | ICD-10-CM | POA: Insufficient documentation

## 2018-06-03 DIAGNOSIS — R072 Precordial pain: Secondary | ICD-10-CM | POA: Diagnosis not present

## 2018-06-03 DIAGNOSIS — R0789 Other chest pain: Secondary | ICD-10-CM | POA: Diagnosis not present

## 2018-06-03 DIAGNOSIS — Z7982 Long term (current) use of aspirin: Secondary | ICD-10-CM | POA: Diagnosis not present

## 2018-06-03 DIAGNOSIS — R079 Chest pain, unspecified: Secondary | ICD-10-CM | POA: Diagnosis not present

## 2018-06-03 HISTORY — DX: Unspecified osteoarthritis, unspecified site: M19.90

## 2018-06-03 LAB — BASIC METABOLIC PANEL
Anion gap: 6 (ref 5–15)
BUN: 13 mg/dL (ref 6–20)
CO2: 27 mmol/L (ref 22–32)
Calcium: 9.1 mg/dL (ref 8.9–10.3)
Chloride: 103 mmol/L (ref 98–111)
Creatinine, Ser: 0.79 mg/dL (ref 0.44–1.00)
GFR calc Af Amer: >60 mL/min (ref >60)
GFR calc non Af Amer: >60 mL/min (ref >60)
Glucose, Bld: 138 mg/dL — ABNORMAL HIGH (ref 70–99)
Potassium: 3.2 mmol/L — ABNORMAL LOW (ref 3.5–5.1)
Sodium: 136 mmol/L (ref 135–145)

## 2018-06-03 LAB — TROPONIN I
Troponin I: <0.03 ng/mL (ref <0.03)
Troponin I: <0.03 ng/mL (ref <0.03)

## 2018-06-03 LAB — CBC
HCT: 41.5 % (ref 36.0–46.0)
Hemoglobin: 14.6 g/dL (ref 12.0–15.0)
MCH: 31.7 pg (ref 26.0–34.0)
MCHC: 35.2 g/dL (ref 30.0–36.0)
MCV: 90.2 fL (ref 78.0–100.0)
Platelets: 241 10*3/uL (ref 150–400)
RBC: 4.6 MIL/uL (ref 3.87–5.11)
RDW: 13.5 % (ref 11.5–15.5)
WBC: 7.4 10*3/uL (ref 4.0–10.5)

## 2018-06-03 LAB — PREGNANCY, URINE: Preg Test, Ur: NEGATIVE

## 2018-06-03 MED ORDER — SODIUM CHLORIDE 0.9 % IV BOLUS
500.0000 mL | Freq: Once | INTRAVENOUS | Status: AC
  Administered 2018-06-03: 500 mL via INTRAVENOUS

## 2018-06-03 MED ORDER — IOPAMIDOL (ISOVUE-370) INJECTION 76%
100.0000 mL | Freq: Once | INTRAVENOUS | Status: AC | PRN
  Administered 2018-06-03: 100 mL via INTRAVENOUS

## 2018-06-03 MED ORDER — FAMOTIDINE IN NACL 20-0.9 MG/50ML-% IV SOLN: 20.0000 mg | Freq: Once | INTRAVENOUS | Status: DC

## 2018-06-03 MED ORDER — GI COCKTAIL ~~LOC~~: 30.0000 mL | Freq: Once | ORAL | Status: DC

## 2018-06-03 NOTE — ED Triage Notes (Signed)
Chest pain radiating to L arm x 15 min. Recently admitted for vertebral artery dissection.

## 2018-06-03 NOTE — ED Provider Notes (Signed)
[facility] EMERGENCY DEPARTMENT Provider Note   CSN: [id number] Arrival date & time: 06/03/18  1326     History   Chief Complaint Chief Complaint  Patient presents with  . Chest Pain    HPI Sophia Costa is a 37 y.o. female.  The history is provided by the patient. No language interpreter was used.  Chest Pain     Sophia Costa is a 37 y.o. female who presents to the Emergency Department complaining of chest pain.  Presents for evaluation of severe, left sided chest pain that began 30 minutes prior to ED arrival. Symptoms began after eating all of garden. The pain is described as sharp in nature and located behind the left breast. It radiates to her neck in her left arm. Following the initial pain it began to improve only to recur again. Two weeks ago she was diagnosed with a vertebral artery dissection and is currently taking aspirin daily. She denies any fevers, nausea, vomiting, shortness of breath, abdominal pain, leg swelling or pain. No prior similar symptoms. She does have a history of rheumatoid arthritis, no recent medication changes. Past Medical History:  Diagnosis Date  . Allergy   . Arthritis   . Chronic kidney disease    microscopic hematuria  . Rheumatoid arthritis(714.0) 7/12    Patient Active Problem List   Diagnosis Date Noted  . Vertebral artery dissection ([place]) 05/21/2018  . Motion sickness 12/07/2017  . Depression, recurrent ([place]) 12/18/2013  . Flu-like symptoms 11/25/2013  . Allergic rhinitis 02/10/2013  . Rheumatoid arthritis ([place]) 07/02/2012  . Hematuria 07/02/2012    Past Surgical History:  Procedure Laterality Date  . CHOLECYSTECTOMY       OB History   None      Home Medications    Prior to Admission medications   Medication Sig Start Date End Date Taking? Authorizing Provider  acetaminophen (TYLENOL) 325 MG tablet Take 650 mg by mouth every 6 (six) hours as needed.   Yes [provider]  Adalimumab  (HUMIRA) 40 MG/0.4ML PSKT Inject 40 mg into the skin every 14 (fourteen) days.   Yes [provider]  aspirin 325 MG tablet Take 1 tablet (325 mg total) by mouth daily. 05/21/18  Yes Oretha Milch D, MD  cetirizine (ZYRTEC) 10 MG tablet Take 10 mg by mouth at bedtime.    Yes [provider]  citalopram (CELEXA) 20 MG tablet Take 1 tablet (20 mg total) by mouth daily. Patient taking differently: Take 20 mg by mouth at bedtime.  12/07/17  Yes Burchette, Alinda Sierras, MD  folic acid (FOLVITE) 1 MG tablet Take 2 mg by mouth at bedtime.  05/30/12  Yes [provider]  methotrexate 50 MG/2ML injection Inject 10 mg into the skin See admin instructions. Inject 10 mg once a week every Friday at bedtime   Yes [provider]  traMADol (ULTRAM) 50 MG tablet Take 50 mg by mouth every 6 (six) hours as needed (for pain).   Yes [provider]  calcium citrate-vitamin D (CITRACAL+D) 315-200 MG-UNIT per tablet Take 2 tablets by mouth daily.    [provider]  Cholecalciferol (VITAMIN D3) 1000 UNITS CAPS Take 1,000 Units by mouth daily.     [provider]  colestipol (COLESTID) 1 g tablet Take 2 g by mouth daily. 05/08/18   [provider]  fish oil-omega-3 fatty acids 1000 MG capsule Take 2 g by mouth daily.    [provider]  glucosamine-chondroitin  500-400 MG tablet Take 1 tablet by mouth daily.     [provider]  ibuprofen (ADVIL,MOTRIN) 200 MG tablet Take 800 mg by mouth every 4 (four) hours as needed (for pain).    [provider]  ondansetron (ZOFRAN ODT) 8 MG disintegrating tablet Take 1 tablet (8 mg total) by mouth every 8 (eight) hours as needed for nausea or vomiting. 09/22/16   Delano Metz, FNP  predniSONE (DELTASONE) 10 MG tablet Take 10 mg by mouth 2 (two) times daily as needed (for R.A. flares).    [provider]  scopolamine (TRANSDERM-SCOP) 1 MG/3DAYS 1 patch behind ear every 72 hours PRN to  prevent motion sickness. 12/07/17   Burchette, Alinda Sierras, MD    Family History Family History  Problem Relation Age of Onset  . Arthritis Mother   . Hypertension Mother   . Kidney disease Father   . Kidney disease Sister        IgA nephropathy  . Arthritis Paternal Grandmother   . Heart disease Paternal Grandfather   . Diabetes Paternal Grandfather     Social History Social History   Tobacco Use  . Smoking status: Never Smoker  . Smokeless tobacco: Never Used  Substance Use Topics  . Alcohol use: Yes    Comment: occ  . Drug use: No     Allergies   Prednisone and Tramadol   Review of Systems Review of Systems  Cardiovascular: Positive for chest pain.  All other systems reviewed and are negative.    Physical Exam Updated Vital Signs BP 105/68   Pulse 85   Temp 98.4 F (36.9 C) (Oral)   Resp 12   SpO2 97%   Physical Exam  Constitutional: She is oriented to person, place, and time. She appears well-developed and well-nourished.  HENT:  Head: Normocephalic and atraumatic.  Cardiovascular: Normal rate and regular rhythm.  No murmur heard. Pulmonary/Chest: Effort normal and breath sounds normal. No respiratory distress. She exhibits no tenderness.  Abdominal: Soft. There is no tenderness. There is no rebound and no guarding.  Musculoskeletal: She exhibits no edema or tenderness.  2+ radial pulses bilaterally  Neurological: She is alert and oriented to person, place, and time.  Skin: Skin is warm and dry.  Psychiatric: She has a normal mood and affect. Her behavior is normal.  Nursing note and vitals reviewed.    ED Treatments / Results  Labs (all labs ordered are listed, but only abnormal results are displayed) Labs Reviewed  BASIC METABOLIC PANEL - Abnormal; Notable for the following components:      Result Value   Potassium 3.2 (*)    Glucose, Bld 138 (*)    All other components within normal limits  CBC  TROPONIN I  PREGNANCY, URINE  TROPONIN I      EKG EKG Interpretation  Date/Time:  Thursday June 03 2018 13:41:10 EDT Ventricular Rate:  105 PR Interval:    QRS Duration: 82 QT Interval:  346 QTC Calculation: 458 R Axis:   53 Text Interpretation:  Sinus tachycardia Borderline T abnormalities, anterior leads Confirmed by Quintella Reichert [phone]) on 06/03/2018 1:44:49 PM   Radiology Dg Chest 2 View  Result Date: 06/03/2018 CLINICAL DATA:  Severe chest pain on the left for the last hour, some left arm pain EXAM: CHEST - 2 VIEW COMPARISON:  Chest x-ray of 06/18/2011 FINDINGS: No active infiltrate or effusion is seen. Mediastinal and hilar contours are unremarkable. The heart is within normal limits in size. No  acute bony abnormality is seen. IMPRESSION: No active cardiopulmonary disease. Electronically Signed   By: Ivar Drape M.D.   On: 06/03/2018 14:12   Ct Angio Chest/abd/pel For Dissection W And/or W/wo  Result Date: 06/03/2018 CLINICAL DATA:  37 year old female with a history of right vertebral artery dissection, with new chest pain EXAM: CT ANGIOGRAPHY CHEST, ABDOMEN AND PELVIS TECHNIQUE: Multidetector CT imaging through the chest, abdomen and pelvis was performed using the standard protocol during bolus administration of intravenous contrast. Multiplanar reconstructed images and MIPs were obtained and reviewed to evaluate the vascular anatomy. CONTRAST:  114mL ISOVUE-370 IOPAMIDOL (ISOVUE-370) INJECTION 76% COMPARISON:  CTA neck 05/20/2018 FINDINGS: CTA CHEST FINDINGS Cardiovascular: Heart: No cardiomegaly. No pericardial fluid/thickening. No significant coronary calcifications. Aorta: Unremarkable course, caliber, contour of the thoracic aorta. No aneurysm or dissection flap. No periaortic fluid. No intramural hematoma Aortic arch branches are patent. Bilateral common carotid artery and left vertebral artery patent at the lower neck. There is decreased caliber of the right vertebral artery, as was previously seen. Bilateral subclavian  arteries are patent. Pulmonary arteries: No proximal filling defects within the pulmonary arteries. Mediastinum/Nodes: No mediastinal adenopathy. Unremarkable appearance of the thoracic esophagus. Unremarkable appearance of the thoracic inlet and thyroid. Lungs/Pleura: Central airways are clear. No pleural effusion. No confluent airspace disease. No pneumothorax. Musculoskeletal: No acute displaced fracture. Degenerative changes of the spine. Review of the MIP images confirms the above findings. CTA ABDOMEN AND PELVIS FINDINGS VASCULAR Aorta: Unremarkable course, caliber, contour of the abdominal aorta. No dissection, aneurysm, or periaortic fluid. Celiac: No significant atherosclerotic changes at the origin of the celiac artery. Typical branch pattern of the celiac artery, with left gastric, common hepatic, and splenic artery identified. SMA: No significant atherosclerotic changes of the superior mesenteric artery. Renals: Renal arteries are patent. Single left and single right renal artery. IMA: Inferior mesenteric artery is patent. Left colic artery patent. Superior rectal artery patent. Right lower extremity: Unremarkable course, caliber, and contour of the right iliac system. No aneurysm, dissection, or occlusion. Hypogastric artery is patent. Anterior and posterior division patent. Common femoral artery patent. Proximal SFA and profunda femoris patent. Left lower extremity: Unremarkable course, caliber, and contour of the left iliac system. No aneurysm, dissection, or occlusion. Hypogastric artery is patent. Anterior and posterior division patent. Common femoral artery patent. Proximal SFA and profunda femoris patent. Veins: Unremarkable appearance of the venous system. Review of the MIP images confirms the above findings. NON-VASCULAR Hepatobiliary: Unremarkable appearance of the liver. Cholecystectomy Pancreas: Unremarkable pancreas Spleen: Unremarkable. Adrenals/Urinary Tract: Unremarkable appearance of  adrenal glands. Right: No hydronephrosis. Symmetric perfusion to the left. No nephrolithiasis. Unremarkable course of the right ureter. Left: No hydronephrosis. Symmetric perfusion to the right. No nephrolithiasis. Unremarkable course of the left ureter. Unremarkable appearance of the urinary bladder . Stomach/Bowel: Unremarkable appearance of the stomach. Unremarkable appearance of small bowel. No evidence of obstruction. No colonic diverticula. Appendix is not visualized, however, no inflammatory changes are present adjacent to the cecum to indicate an appendicitis. Lymphatic: No lymphadenopathy Mesenteric: No free fluid or air. No adenopathy. Reproductive: IUD.  Physiologic changes of the adnexa Other: No hernia. Musculoskeletal: No evidence of acute fracture. No bony canal narrowing. No significant degenerative changes of the hips. IMPRESSION: No acute CT finding of the chest, abdomen, pelvis. No evidence of acute aortic syndrome. Signed, Dulcy Fanny. Dellia Nims, RPVI [facility] Radiology Electronically Signed   By: Corrie Mckusick D.O.   On: 06/03/2018 14:57  Procedures Procedures (including critical care time)  Medications Ordered in ED Medications  sodium chloride 0.9 % bolus 500 mL (500 mLs Intravenous New Bag/Given 06/03/18 1531)  gi cocktail (Maalox,Lidocaine,Donnatal) (30 mLs Oral Refused 06/03/18 1533)  famotidine (PEPCID) IVPB 20 mg premix (20 mg Intravenous Refused 06/03/18 1534)  iopamidol (ISOVUE-370) 76 % injection 100 mL (100 mLs Intravenous Contrast Given 06/03/18 1423)     Initial Impression / Assessment and Plan / ED Course  I have reviewed the triage vital signs and the nursing notes.  Pertinent labs & imaging results that were available during my care of the patient were reviewed by me and considered in my medical decision making (see chart for details).     Patient with history of rheumatoid arthritis and spontaneous vertebral  artery dissection here for evaluation of acute onset left sided chest pain. EKG with no acute ischemic abnormalities. Given her recent history CTA was obtained. CT is negative for acute dissection. Initial troponin is negative. Current presentation is not consistent with ACS, pneumonia, PE. Discussed with patient treating for possible reflux and patient declines treatment in the department. Plan to recheck troponin. If troponin is within normal limits plan to discharge home with outpatient follow-up and return precautions.  Final Clinical Impressions(s) / ED Diagnoses   Final diagnoses:  Precordial pain    ED Discharge Orders    None       Quintella Reichert, MD 06/03/18 1554

## 2018-06-03 NOTE — ED Notes (Signed)
Pt refused the orders to be given a gi cocktail and pepcid; fluid was given

## 2018-06-14 DIAGNOSIS — M0579 Rheumatoid arthritis with rheumatoid factor of multiple sites without organ or systems involvement: Secondary | ICD-10-CM | POA: Diagnosis not present

## 2018-06-14 DIAGNOSIS — M542 Cervicalgia: Secondary | ICD-10-CM | POA: Diagnosis not present

## 2018-06-14 DIAGNOSIS — R5382 Chronic fatigue, unspecified: Secondary | ICD-10-CM | POA: Diagnosis not present

## 2018-06-14 DIAGNOSIS — R319 Hematuria, unspecified: Secondary | ICD-10-CM | POA: Diagnosis not present

## 2018-06-28 DIAGNOSIS — K580 Irritable bowel syndrome with diarrhea: Secondary | ICD-10-CM | POA: Diagnosis not present

## 2018-07-19 DIAGNOSIS — R319 Hematuria, unspecified: Secondary | ICD-10-CM | POA: Diagnosis not present

## 2018-07-19 DIAGNOSIS — R809 Proteinuria, unspecified: Secondary | ICD-10-CM | POA: Diagnosis not present

## 2018-07-19 DIAGNOSIS — N182 Chronic kidney disease, stage 2 (mild): Secondary | ICD-10-CM | POA: Diagnosis not present

## 2018-07-19 NOTE — Progress Notes (Signed)
[facility] [address] [place] [address] [phone]       OFFICE FOLLOW UP NOTE  [name] Date of Birth:  15-Jan-1981 Medical Record Number:  [id number]   Reason for Referral:  hospital stroke follow up  CHIEF COMPLAINT:  Chief Complaint  Patient presents with  . Vertebral Artery Dissection    Rm. 9.  PCP: Carolann Littler.  Sophia Costa is here with her husband Cindee Salt to discuss recent dx. of vertebral artery dissection.  On 05/20/18, she presented to the ER with right sided neck pain, numbness/tingling in right hand.  MRI neck was suspicious for a vertebral artery dissection and CT angio confirmed it.  MRI brain r/o CVA.  She was started on daily ASA 325mg , which she reports she is compliant with.  Here today to plan f/u CT angio.  Sts. numbness/tingling in righr hand has resolved; still c/o pian   . Back Pain    right upper back and now sts. she still notes more trouble with word finding, losing her train of thought, remembering names/fim    HPI: Sophia Costa is being seen today for initial visit in the office for right vertebral artery dissection in the setting of vomiting on 05/20/2018. History obtained from patient, husband and chart review. Reviewed all radiology images and labs personally.  Sophia Costa a 37 y.o.year old femalewith medical history significant for RA on humira and Methotrexate and Depressionwho presented on 6/27/2019after outpatient MRI neck ordered by her rheumatologist for evaluation of progressively worsening right sided neck pain was concerning for possible vertebral artery abnormality.Per notes, her right sided neck pain started shortly after a "stomach bug" and some recent vomiting. She started having progressively worsening right sided neck pain as well as numbness and tingling in her right hand. Denied any weakness or numbness outside of her hand, no falls, no chest pain, no changes in vision. CTA in ED confirmed  right vertebral artery dissection, and right V1 and V2 segments with moderate to severe diffuse stenosis.  CT head and MRI head negative for acute abnormality.  LDL 92 and A1c 5.0.  Recommended aspirin 325 mg for 3 to 6 months and repeat CTA recommended in 8 weeks.  Per notes, patient does have a family history of multiple family members who are double jointed and being worked up for connective tissue disorder along with her maternal grandmother history of brain aneurysm.  Recommended follow-up with rheumatology due to history of rheumatoid arthritis and a family history of connective tissue disorder for work-up of disorders that can cause increased risk of vascular disorder such as dissections.  Patient was discharged home in stable condition.   Patient is being seen today for hospital follow-up and is accompanied by her husband.  She continues to have some right-sided neck pain but has had improvement along with back\shoulder pain.  She does take Tylenol as needed for pain management.  She continues to take aspirin 325 mg daily without side effects of bleeding or bruising.  Patient has been frustrated in the beginning of appointment due to them calling back in mid July requesting a sooner appointment due to patient's continued neck pain and "lack of information provided during hospitalization" per patient.  Per patient and husband, they were told that they were unable to obtain a sooner appointment due to hospital recommendations of being seen 8 weeks.  No phone no is able to be found but patient and husband were sincerely apologize to and this  will be further looked into.  CTA head placed at GI and they will be called to schedule appointment for follow-up of vertebral artery dissection.  Right hand numbness has completely resolved.  Patient did have follow-up appointment with rheumatology and was found to be negative for connective tissue disorder.  Denies new or worsening symptoms at this time.   ROS:   14  system review of systems performed and negative with exception of weight loss, fatigue, chest pain, blood in urine, moles, allergies, confusion, headache and snoring  PMH:  Past Medical History:  Diagnosis Date  . Allergy   . Arthritis   . Chronic kidney disease    microscopic hematuria  . Rheumatoid arthritis(714.0) 7/12    PSH:  Past Surgical History:  Procedure Laterality Date  . CHOLECYSTECTOMY      Social History:  Social History   Socioeconomic History  . Marital status: Married    Spouse name: Not on file  . Number of children: Not on file  . Years of education: Not on file  . Highest education level: Not on file  Occupational History  . Not on file  Social Needs  . Financial resource strain: Not on file  . Food insecurity:    Worry: Not on file    Inability: Not on file  . Transportation needs:    Medical: Not on file    Non-medical: Not on file  Tobacco Use  . Smoking status: Never Smoker  . Smokeless tobacco: Never Used  Substance and Sexual Activity  . Alcohol use: Yes    Comment: occ  . Drug use: No  . Sexual activity: Not on file  Lifestyle  . Physical activity:    Days per week: Not on file    Minutes per session: Not on file  . Stress: Not on file  Relationships  . Social connections:    Talks on phone: Not on file    Gets together: Not on file    Attends religious service: Not on file    Active member of club or organization: Not on file    Attends meetings of clubs or organizations: Not on file    Relationship status: Not on file  . Intimate partner violence:    Fear of current or ex partner: Not on file    Emotionally abused: Not on file    Physically abused: Not on file    Forced sexual activity: Not on file  Other Topics Concern  . Not on file  Social History Narrative  . Not on file    Family History:  Family History  Problem Relation Age of Onset  . Arthritis Mother   . Hypertension Mother   . Kidney disease Father   .  Hypertension Father   . Kidney disease Sister        IgA nephropathy  . Hypertension Sister   . Arthritis Paternal Grandmother   . Rheum arthritis Paternal Grandmother   . Heart disease Paternal Grandfather   . Diabetes Paternal Grandfather   . Aneurysm Maternal Grandmother        brain    Medications:   Current Outpatient Medications on File Prior to Visit  Medication Sig Dispense Refill  . acetaminophen (TYLENOL) 325 MG tablet Take 650 mg by mouth every 6 (six) hours as needed.    . Adalimumab (HUMIRA) 40 MG/0.4ML PSKT Inject 40 mg into the skin every 14 (fourteen) days.    Marland Kitchen aspirin 325 MG tablet Take 1 tablet (  325 mg total) by mouth daily. 60 tablet 1  . cetirizine (ZYRTEC) 10 MG tablet Take 10 mg by mouth at bedtime.     . Cholecalciferol (VITAMIN D3) 1000 UNITS CAPS Take 1,000 Units by mouth daily.     . citalopram (CELEXA) 20 MG tablet Take 1 tablet (20 mg total) by mouth daily. (Patient taking differently: Take 20 mg by mouth at bedtime. ) 90 tablet 3  . colestipol (COLESTID) 1 g tablet Take 2 g by mouth daily.  3  . folic acid (FOLVITE) 1 MG tablet Take 2 mg by mouth at bedtime.     . methotrexate 50 MG/2ML injection Inject 10 mg into the skin See admin instructions. Inject 10 mg once a week every Friday at bedtime    . calcium citrate-vitamin D (CITRACAL+D) 315-200 MG-UNIT per tablet Take 2 tablets by mouth daily.    . fish oil-omega-3 fatty acids 1000 MG capsule Take 2 g by mouth daily.    Marland Kitchen glucosamine-chondroitin 500-400 MG tablet Take 1 tablet by mouth daily.     Marland Kitchen ibuprofen (ADVIL,MOTRIN) 200 MG tablet Take 800 mg by mouth every 4 (four) hours as needed (for pain).    . ondansetron (ZOFRAN ODT) 8 MG disintegrating tablet Take 1 tablet (8 mg total) by mouth every 8 (eight) hours as needed for nausea or vomiting. (Patient not taking: Reported on 07/20/2018) 20 tablet 0  . predniSONE (DELTASONE) 10 MG tablet Take 10 mg by mouth 2 (two) times daily as needed (for R.A. flares).     Marland Kitchen scopolamine (TRANSDERM-SCOP) 1 MG/3DAYS 1 patch behind ear every 72 hours PRN to prevent motion sickness. (Patient not taking: Reported on 07/20/2018) 10 patch 1  . traMADol (ULTRAM) 50 MG tablet Take 50 mg by mouth every 6 (six) hours as needed (for pain).     No current facility-administered medications on file prior to visit.     Allergies:   Allergies  Allergen Reactions  . Prednisone Shortness Of Breath    Reacted to a dose greater than 10 mg (1st day of a taper pack)  . Tramadol Nausea Only     Physical Exam  Vitals:   07/20/18 0910  BP: 107/74  Pulse: 83  Resp: 16  Weight: 173 lb 9.6 oz (78.7 kg)  Height: 5\' 4"  (1.626 m)   Body mass index is 29.8 kg/m. No exam data present  General: well developed, well nourished, pleasant middle-aged Caucasian female, seated, in no evident distress Head: head normocephalic and atraumatic.   Neck: supple with no carotid or supraclavicular bruits Cardiovascular: regular rate and rhythm, no murmurs Musculoskeletal: no deformity Skin:  no rash/petichiae Vascular:  Normal pulses all extremities  Neurologic Exam Mental Status: Awake and fully alert. Oriented to place and time. Recent and remote memory intact. Attention span, concentration and fund of knowledge appropriate. Mood and affect appropriate.  Cranial Nerves: Fundoscopic exam reveals sharp disc margins. Pupils equal, briskly reactive to light. Extraocular movements full without nystagmus. Visual fields full to confrontation. Hearing intact. Facial sensation intact. Face, tongue, palate moves normally and symmetrically.  Motor: Normal bulk and tone. Normal strength in all tested extremity muscles except for mild RUE weakness (chronic for patient due to RA) Sensory.: intact to touch , pinprick , position and vibratory sensation.  Coordination: Rapid alternating movements normal in all extremities. Finger-to-nose and heel-to-shin performed accurately bilaterally. Gait and  Station: Arises from chair without difficulty. Stance is normal. Gait demonstrates normal stride length and balance . Able  to heel, toe and tandem walk without difficulty.  Reflexes: 1+ and symmetric. Toes downgoing.    Diagnostic Data (Labs, Imaging, Testing)  MR BRAIN WO CONTRAST 05/21/2018 IMPRESSION: Normal MRI of the brain.  Negative for infarct  CT ANGIO HEAD W OR WO CONTRAST CT ANGIO NECK W OR WO CONTRAST 05/20/2018 IMPRESSION: 1. 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 2. Otherwise normal CTA of the head and neck.  MR cervical spine without contrast 05/20/2018 IMPRESSION: 1. Irregular flow void within the right vertebral artery, of uncertain chronicity or significance, but could reflect an acute vascular injury/dissection in the setting of acute neck pain. Further evaluation with CTA recommended. 2. Otherwise unremarkable MRI of the cervical spine. No other acute abnormality identified.     ASSESSMENT: XANA BRADT is a 37 y.o. year old female here with right vertebral artery dissection on 05/20/2018 secondary to vomiting attributed to gastroenteritis. Vascular risk factors include RA and FHx of connective tissue disease.  Patient seen today for hospital follow-up and does continue to have slight back and neck pain but this has been improving.    PLAN: -Continue aspirin 325 mg daily  for secondary stroke prevention -Repeat CTA for evaluation of right vertebral artery dissection -F/u with rheumatologist regarding RA management -continue to monitor BP at home -advised to continue to stay active and maintain a healthy diet -Maintain strict control of hypertension with blood pressure goal below 130/90, diabetes with hemoglobin A1c goal below 6.5% and cholesterol with LDL cholesterol (bad cholesterol) goal below 70 mg/dL. I also advised the patient to eat a healthy diet with plenty of whole  grains, cereals, fruits and vegetables, exercise regularly and maintain ideal body weight.  Follow up as needed at this time or call earlier if needed.  Will call patients with CTA results and decide at that point if follow-up appointment as needed   Greater than 50% of time during this 25 minute visit was spent on counseling,explanation of diagnosis of right vertebral artery dissection, reviewing risk factor management of RA, planning of further management, discussion with patient and family and coordination of care    Venancio Poisson, [facility]  [facility] [address] [place], [place] [address]  Phone [phone] Fax [phone] Note: This document was prepared with digital dictation and possible smart phrase technology. Any transcriptional errors that result from this process are unintentional.

## 2018-07-20 ENCOUNTER — Encounter: Payer: Self-pay | Admitting: Adult Health

## 2018-07-20 ENCOUNTER — Ambulatory Visit: Payer: 59 | Admitting: Adult Health

## 2018-07-20 ENCOUNTER — Other Ambulatory Visit: Payer: Self-pay

## 2018-07-20 ENCOUNTER — Telehealth: Payer: Self-pay | Admitting: Adult Health

## 2018-07-20 VITALS — BP 107/74 | HR 83 | Resp 16 | Ht 64.0 in | Wt 173.6 lb

## 2018-07-20 DIAGNOSIS — M069 Rheumatoid arthritis, unspecified: Secondary | ICD-10-CM | POA: Diagnosis not present

## 2018-07-20 DIAGNOSIS — I7774 Dissection of vertebral artery: Secondary | ICD-10-CM | POA: Diagnosis not present

## 2018-07-20 NOTE — Patient Instructions (Addendum)
Continue aspirin 325 mg daily for secondary stroke prevention  Continue to follow up with PCP regarding cholesterol and blood pressure management   Continue to monitor blood pressure at home  You will be called regarding scheduling for CT angio head   Maintain strict control of hypertension with blood pressure goal below 130/90, diabetes with hemoglobin A1c goal below 6.5% and cholesterol with LDL cholesterol (bad cholesterol) goal below 70 mg/dL. I also advised the patient to eat a healthy diet with plenty of whole grains, cereals, fruits and vegetables, exercise regularly and maintain ideal body weight.  Followup in the future with me as needed or call earlier if needed       Thank you for coming to see [place] at [facility]. I hope we have been able to provide you high quality care today.  You may receive a patient satisfaction survey over the next few weeks. We would appreciate your feedback and comments so that we may continue to improve ourselves and the health of our patients.

## 2018-07-20 NOTE — Telephone Encounter (Signed)
[facility] pending faxed clinical notes

## 2018-07-21 NOTE — Telephone Encounter (Signed)
[facility]: [id number] (exp. 07/20/18 to 09/03/18) order sent to GI . They will reach out to the pt to schedule.

## 2018-07-22 ENCOUNTER — Telehealth: Payer: Self-pay | Admitting: Adult Health

## 2018-07-22 ENCOUNTER — Ambulatory Visit
Admission: RE | Admit: 2018-07-22 | Discharge: 2018-07-22 | Disposition: A | Discharge status: Home / Self Care | Payer: 59 | Source: Ambulatory Visit | Attending: Adult Health | Admitting: Adult Health

## 2018-07-22 DIAGNOSIS — M542 Cervicalgia: Secondary | ICD-10-CM | POA: Diagnosis not present

## 2018-07-22 MED ORDER — IOPAMIDOL (ISOVUE-370) INJECTION 76%
75.0000 mL | Freq: Once | INTRAVENOUS | Status: AC | PRN
  Administered 2018-07-22: 75 mL via INTRAVENOUS

## 2018-07-22 NOTE — Telephone Encounter (Signed)
Spoke to patient regarding recent CTA neck which did show healing of the right vertebral artery dissection with improved and nearly normalized appearance of the right V1 and V2 segments without residual right vertebral artery stenosis.  Left vertebral and both carotid arteries remain normal to the skull base.  After speaking with Dr. Leonie Man, it was recommended for patient to continue on aspirin 325 mg for life as she is tolerating this well without negative side effects.  Patient will follow-up in 6 months time or call earlier if needed.  No need at this time for any repeat imaging.  Patient very appreciative call and no further questions at this time.

## 2018-07-23 NOTE — Progress Notes (Signed)
I agree with the above plan 

## 2018-09-06 ENCOUNTER — Ambulatory Visit: Payer: 59 | Admitting: Family Medicine

## 2018-09-06 DIAGNOSIS — K580 Irritable bowel syndrome with diarrhea: Secondary | ICD-10-CM | POA: Diagnosis not present

## 2018-09-06 DIAGNOSIS — R1013 Epigastric pain: Secondary | ICD-10-CM | POA: Diagnosis not present

## 2018-09-14 ENCOUNTER — Telehealth: Payer: Self-pay

## 2018-09-14 NOTE — Telephone Encounter (Signed)
Sophia Billow NP clearance recommendations states on form. Typically recommend waiting  to go undergo procedures post stroke 6 months. Pt had stroke 05/20/2018. Cleared if proceed if aspirin does not have to be stop. If aspirin has to be stop needs to wait a full 6 months unless its emergent. Form fax twice and confirmed.

## 2018-09-15 DIAGNOSIS — R5382 Chronic fatigue, unspecified: Secondary | ICD-10-CM | POA: Diagnosis not present

## 2018-09-15 DIAGNOSIS — R319 Hematuria, unspecified: Secondary | ICD-10-CM | POA: Diagnosis not present

## 2018-09-15 DIAGNOSIS — M0579 Rheumatoid arthritis with rheumatoid factor of multiple sites without organ or systems involvement: Secondary | ICD-10-CM | POA: Diagnosis not present

## 2018-09-15 NOTE — Telephone Encounter (Signed)
Correct. Thank you.

## 2018-09-28 DIAGNOSIS — Z6829 Body mass index (BMI) 29.0-29.9, adult: Secondary | ICD-10-CM | POA: Diagnosis not present

## 2018-09-28 DIAGNOSIS — Z01419 Encounter for gynecological examination (general) (routine) without abnormal findings: Secondary | ICD-10-CM | POA: Diagnosis not present

## 2018-11-25 ENCOUNTER — Emergency Department (HOSPITAL_COMMUNITY): Payer: 59

## 2018-11-25 ENCOUNTER — Telehealth: Payer: Self-pay | Admitting: Adult Health

## 2018-11-25 ENCOUNTER — Emergency Department (HOSPITAL_COMMUNITY)
Admission: EM | Admit: 2018-11-25 | Discharge: 2018-11-25 | Disposition: A | Discharge status: Home / Self Care | Payer: 59 | Attending: Emergency Medicine | Admitting: Emergency Medicine

## 2018-11-25 ENCOUNTER — Other Ambulatory Visit: Payer: Self-pay

## 2018-11-25 DIAGNOSIS — Z7982 Long term (current) use of aspirin: Secondary | ICD-10-CM | POA: Insufficient documentation

## 2018-11-25 DIAGNOSIS — M25511 Pain in right shoulder: Secondary | ICD-10-CM | POA: Diagnosis not present

## 2018-11-25 DIAGNOSIS — Z79899 Other long term (current) drug therapy: Secondary | ICD-10-CM | POA: Diagnosis not present

## 2018-11-25 DIAGNOSIS — G441 Vascular headache, not elsewhere classified: Secondary | ICD-10-CM

## 2018-11-25 DIAGNOSIS — R519 Headache, unspecified: Secondary | ICD-10-CM

## 2018-11-25 DIAGNOSIS — R000 Tachycardia, unspecified: Secondary | ICD-10-CM | POA: Diagnosis not present

## 2018-11-25 DIAGNOSIS — M542 Cervicalgia: Secondary | ICD-10-CM | POA: Diagnosis not present

## 2018-11-25 DIAGNOSIS — R51 Headache: Secondary | ICD-10-CM | POA: Diagnosis present

## 2018-11-25 DIAGNOSIS — M069 Rheumatoid arthritis, unspecified: Secondary | ICD-10-CM | POA: Insufficient documentation

## 2018-11-25 LAB — DIFFERENTIAL
Abs Immature Granulocytes: 0.02 10*3/uL (ref 0.00–0.07)
Basophils Absolute: 0.1 10*3/uL (ref 0.0–0.1)
Basophils Relative: 1 %
Eosinophils Absolute: 0.2 10*3/uL (ref 0.0–0.5)
Eosinophils Relative: 2 %
Immature Granulocytes: 0 %
Lymphocytes Relative: 35 %
Lymphs Abs: 3.5 10*3/uL (ref 0.7–4.0)
Monocytes Absolute: 0.9 10*3/uL (ref 0.1–1.0)
Monocytes Relative: 9 %
Neutro Abs: 5.2 10*3/uL (ref 1.7–7.7)
Neutrophils Relative %: 53 %

## 2018-11-25 LAB — COMPREHENSIVE METABOLIC PANEL
ALT: 23 U/L (ref 0–44)
AST: 25 U/L (ref 15–41)
Albumin: 4.6 g/dL (ref 3.5–5.0)
Alkaline Phosphatase: 62 U/L (ref 38–126)
Anion gap: 9 (ref 5–15)
BUN: 11 mg/dL (ref 6–20)
CO2: 28 mmol/L (ref 22–32)
Calcium: 9.7 mg/dL (ref 8.9–10.3)
Chloride: 103 mmol/L (ref 98–111)
Creatinine, Ser: 0.85 mg/dL (ref 0.44–1.00)
GFR calc Af Amer: >60 mL/min (ref >60)
GFR calc non Af Amer: >60 mL/min (ref >60)
Glucose, Bld: 96 mg/dL (ref 70–99)
Potassium: 3.6 mmol/L (ref 3.5–5.1)
Sodium: 140 mmol/L (ref 135–145)
Total Bilirubin: 0.7 mg/dL (ref 0.3–1.2)
Total Protein: 8.1 g/dL (ref 6.5–8.1)

## 2018-11-25 LAB — RAPID URINE DRUG SCREEN, HOSP PERFORMED
Amphetamines: NOT DETECTED
Barbiturates: NOT DETECTED
Benzodiazepines: NOT DETECTED
Cocaine: NOT DETECTED
Opiates: NOT DETECTED
Tetrahydrocannabinol: NOT DETECTED

## 2018-11-25 LAB — URINALYSIS, ROUTINE W REFLEX MICROSCOPIC
Bacteria, UA: NONE SEEN
Bilirubin Urine: NEGATIVE
Glucose, UA: NEGATIVE mg/dL
Ketones, ur: NEGATIVE mg/dL
Nitrite: NEGATIVE
Protein, ur: NEGATIVE mg/dL
Specific Gravity, Urine: 1.021 (ref 1.005–1.030)
pH: 5 (ref 5.0–8.0)

## 2018-11-25 LAB — CBC
HCT: 47.1 % — ABNORMAL HIGH (ref 36.0–46.0)
Hemoglobin: 15.6 g/dL — ABNORMAL HIGH (ref 12.0–15.0)
MCH: 31.3 pg (ref 26.0–34.0)
MCHC: 33.1 g/dL (ref 30.0–36.0)
MCV: 94.4 fL (ref 80.0–100.0)
Platelets: 238 10*3/uL (ref 150–400)
RBC: 4.99 MIL/uL (ref 3.87–5.11)
RDW: 13.5 % (ref 11.5–15.5)
WBC: 9.9 10*3/uL (ref 4.0–10.5)
nRBC: 0 % (ref 0.0–0.2)

## 2018-11-25 LAB — I-STAT BETA HCG BLOOD, ED (MC, WL, AP ONLY): I-stat hCG, quantitative: <5 m[IU]/mL (ref <5)

## 2018-11-25 LAB — I-STAT TROPONIN, ED: Troponin i, poc: 0.01 ng/mL (ref 0.00–0.08)

## 2018-11-25 LAB — PROTIME-INR
INR: 0.88
Prothrombin Time: 11.8 seconds (ref 11.4–15.2)

## 2018-11-25 LAB — APTT: aPTT: 30 seconds (ref 24–36)

## 2018-11-25 LAB — CBG MONITORING, ED: Glucose-Capillary: 93 mg/dL (ref 70–99)

## 2018-11-25 MED ORDER — IOPAMIDOL (ISOVUE-370) INJECTION 76%
INTRAVENOUS | Status: AC
  Administered 2018-11-25: 100 mL
  Filled 2018-11-25: qty 100

## 2018-11-25 MED ORDER — PROCHLORPERAZINE EDISYLATE 10 MG/2ML IJ SOLN
10.0000 mg | Freq: Once | INTRAMUSCULAR | Status: AC
  Administered 2018-11-25: 10 mg via INTRAVENOUS
  Filled 2018-11-25: qty 2

## 2018-11-25 MED ORDER — DIPHENHYDRAMINE HCL 50 MG/ML IJ SOLN
25.0000 mg | Freq: Once | INTRAMUSCULAR | Status: AC
  Administered 2018-11-25: 25 mg via INTRAVENOUS
  Filled 2018-11-25: qty 1

## 2018-11-25 NOTE — ED Notes (Signed)
Pt reports feeling "hot, jittery and not feeling right". EDP made aware.

## 2018-11-25 NOTE — ED Notes (Signed)
Patient transported to CT 

## 2018-11-25 NOTE — Telephone Encounter (Addendum)
As insurance was unable to approved imaging today or tomorrow to rule out recurrent dissection, patient was called to inform her that it is highly recommended that she be evaluated further at [facility] ED.  She does state that the right-sided neck pain and headache are similar to her prior symptoms when she was diagnosed with dissection.  She feels as though the headache has been worsening as the day goes on and currently experiencing slight blurry vision.  It was recommended that patient call EMS to be transferred to ED for further evaluation.  Patient verbalized understanding and plans on proceeding to ED.

## 2018-11-25 NOTE — Telephone Encounter (Addendum)
3 days ago started to experience HA. Has worsened since this time with now right sided neck pain, severe which are similar symptoms when she was found to have dissection in 04/2018. Experienced left arm numbness this AM for 1 hour. She feels as though these symptoms are close to her prior symptoms when she experienced the dissection. Has attempted OTC tylenol without benefit of HA or neck pain. Pain 7-8/10. Order placed for CTA head/neck.

## 2018-11-25 NOTE — ED Provider Notes (Signed)
[facility] EMERGENCY DEPARTMENT Provider Note   CSN: [id number] Arrival date & time: 11/25/18  1629     History   Chief Complaint Chief Complaint  Patient presents with  . Headache    HPI Sophia Costa is a 38 y.o. female.  The history is provided by the patient.  Headache   This is a new problem. The current episode started more than 2 days ago. The problem occurs hourly. The problem has been gradually worsening. Associated with: Hx of RA, vertebral artery dissection. The pain is located in the occipital (right cervical, occipital ) region. The quality of the pain is described as dull. The pain is at a severity of 7/10. The pain is moderate. The pain radiates to the right neck and right arm. Pertinent negatives include no anorexia, no fever, no malaise/fatigue, no near-syncope, no palpitations, no syncope, no shortness of breath, no nausea and no vomiting. She has tried nothing for the symptoms. The treatment provided no relief.    Past Medical History:  Diagnosis Date  . Allergy   . Arthritis   . Chronic kidney disease    microscopic hematuria  . Rheumatoid arthritis(714.0) 7/12    Patient Active Problem List   Diagnosis Date Noted  . Vertebral artery dissection ([place]) 05/21/2018  . Motion sickness 12/07/2017  . Depression, recurrent ([place]) 12/18/2013  . Flu-like symptoms 11/25/2013  . Allergic rhinitis 02/10/2013  . Rheumatoid arthritis ([place]) 07/02/2012  . Hematuria 07/02/2012    Past Surgical History:  Procedure Laterality Date  . CHOLECYSTECTOMY       OB History   No obstetric history on file.      Home Medications    Prior to Admission medications   Medication Sig Start Date End Date Taking? Authorizing Provider  acetaminophen (TYLENOL) 325 MG tablet Take 650 mg by mouth every 6 (six) hours as needed.   Yes [provider]  Adalimumab (HUMIRA) 40 MG/0.4ML PSKT Inject 40 mg into the skin every 14 (fourteen) days.   Yes  [provider]  aspirin 325 MG tablet Take 1 tablet (325 mg total) by mouth daily. 05/21/18  Yes Oretha Milch D, MD  cetirizine (ZYRTEC) 10 MG tablet Take 10 mg by mouth at bedtime.    Yes [provider]  citalopram (CELEXA) 20 MG tablet Take 1 tablet (20 mg total) by mouth daily. Patient taking differently: Take 20 mg by mouth at bedtime.  12/07/17  Yes Burchette, Alinda Sierras, MD  colestipol (COLESTID) 1 g tablet Take 2 g by mouth daily. 05/08/18  Yes [provider]  folic acid (FOLVITE) 1 MG tablet Take 2 mg by mouth at bedtime.  05/30/12  Yes [provider]  methotrexate 50 MG/2ML injection Inject 10 mg into the skin See admin instructions. Inject 10 mg once a week every Friday at bedtime   Yes [provider]  predniSONE (DELTASONE) 10 MG tablet Take 10 mg by mouth 2 (two) times daily as needed (for R.A. flares).   Yes [provider]  traMADol (ULTRAM) 50 MG tablet Take 50 mg by mouth every 6 (six) hours as needed (for pain).   Yes [provider]  ondansetron (ZOFRAN ODT) 8 MG disintegrating tablet Take 1 tablet (8 mg total) by mouth every 8 (eight) hours as needed for nausea or vomiting. Patient not taking: Reported on 07/20/2018 09/22/16   Delano Metz, FNP  scopolamine (TRANSDERM-SCOP) 1 MG/3DAYS 1 patch behind ear every 72 hours PRN to prevent  motion sickness. Patient not taking: Reported on 07/20/2018 12/07/17   Eulas Post, MD    Family History Family History  Problem Relation Age of Onset  . Arthritis Mother   . Hypertension Mother   . Kidney disease Father   . Hypertension Father   . Kidney disease Sister        IgA nephropathy  . Hypertension Sister   . Arthritis Paternal Grandmother   . Rheum arthritis Paternal Grandmother   . Heart disease Paternal Grandfather   . Diabetes Paternal Grandfather   . Aneurysm Maternal Grandmother        brain    Social History Social History   Tobacco Use  .  Smoking status: Never Smoker  . Smokeless tobacco: Never Used  Substance Use Topics  . Alcohol use: Yes    Comment: occ  . Drug use: No     Allergies   Prednisone and Tramadol   Review of Systems Review of Systems  Constitutional: Negative for chills, fever and malaise/fatigue.  HENT: Negative for ear pain and sore throat.   Eyes: Negative for pain and visual disturbance.  Respiratory: Negative for cough and shortness of breath.   Cardiovascular: Negative for chest pain, palpitations, syncope and near-syncope.  Gastrointestinal: Negative for abdominal pain, anorexia, nausea and vomiting.  Genitourinary: Negative for dysuria and hematuria.  Musculoskeletal: Negative for arthralgias, back pain, gait problem and joint swelling.  Skin: Negative for color change, rash and wound.  Neurological: Positive for dizziness, light-headedness and headaches. Negative for tremors, seizures, syncope, facial asymmetry, speech difficulty, weakness and numbness.  All other systems reviewed and are negative.    Physical Exam Updated Vital Signs ED Triage Vitals  Enc Vitals Group     BP 11/25/18 1643 140/85     Pulse Rate 11/25/18 1643 (!) 105     Resp 11/25/18 1643 16     Temp 11/25/18 1643 98.5 F (36.9 C)     Temp Source 11/25/18 1643 Oral     SpO2 11/25/18 1643 100 %     Weight 11/25/18 1645 170 lb (77.1 kg)     Height 11/25/18 1645 5\' 4"  (1.626 m)     Head Circumference --      Peak Flow --      Pain Score 11/25/18 1641 8     Pain Loc --      Pain Edu? --      Excl. in [place]? --     Physical Exam Vitals signs and nursing note reviewed.  Constitutional:      General: She is not in acute distress.    Appearance: She is well-developed.  HENT:     Head: Normocephalic and atraumatic.     Nose: Nose normal.     Mouth/Throat:     Mouth: Mucous membranes are moist.  Eyes:     Extraocular Movements: Extraocular movements intact.     Conjunctiva/sclera: Conjunctivae normal.      Pupils: Pupils are equal, round, and reactive to light.  Neck:     Musculoskeletal: Normal range of motion and neck supple. Muscular tenderness (TTP to paraspinal right sided cervical muscles ) present. No neck rigidity.  Cardiovascular:     Rate and Rhythm: Normal rate and regular rhythm.     Pulses: Normal pulses.     Heart sounds: Normal heart sounds. No murmur.  Pulmonary:     Effort: Pulmonary effort is normal. No respiratory distress.     Breath sounds: Normal breath sounds.  Abdominal:     General: There is no distension.     Palpations: Abdomen is soft.     Tenderness: There is no abdominal tenderness.  Musculoskeletal: Normal range of motion.        General: Tenderness (TTP to right trapezius ) present. No swelling.     Right lower leg: No edema.     Left lower leg: No edema.  Skin:    General: Skin is warm and dry.     Capillary Refill: Capillary refill takes less than 2 seconds.  Neurological:     General: No focal deficit present.     Mental Status: She is alert and oriented to person, place, and time.     Cranial Nerves: No cranial nerve deficit.     Sensory: No sensory deficit.     Coordination: Coordination normal.     Comments: Patient with 5+ out of 5 strength in the left upper extremity, patient with grossly normal strength in the right upper extremity as effort dependent as patient states that she has chronic weakness in the right upper extremity due to rheumatoid arthritis patient states that she is at baseline strength, normal sensation throughout, 20/20 vision bilaterally, no visual field deficits, cranial nerves are intact, normal speech, normal gait, normal finger-to-nose finger, normal heel-to-shin  Psychiatric:        Mood and Affect: Mood normal.      ED Treatments / Results  Labs (all labs ordered are listed, but only abnormal results are displayed) Labs Reviewed  CBC - Abnormal; Notable for the following components:      Result Value   Hemoglobin  15.6 (*)    HCT 47.1 (*)    All other components within normal limits  URINALYSIS, ROUTINE W REFLEX MICROSCOPIC - Abnormal; Notable for the following components:   APPearance HAZY (*)    Hgb urine dipstick LARGE (*)    Leukocytes, UA TRACE (*)    All other components within normal limits  PROTIME-INR  APTT  DIFFERENTIAL  COMPREHENSIVE METABOLIC PANEL  RAPID URINE DRUG SCREEN, HOSP PERFORMED  CBG MONITORING, ED  I-STAT TROPONIN, ED  I-STAT BETA HCG BLOOD, ED ([facility], [facility], [facility] ONLY)    EKG EKG Interpretation  Date/Time:  Thursday November 25 2018 16:41:40 EST Ventricular Rate:  108 PR Interval:    QRS Duration: 83 QT Interval:  325 QTC Calculation: 436 R Axis:   51 Text Interpretation:  Sinus tachycardia Confirmed by Lennice Sites [phone]) on 11/25/2018 7:42:02 PM   Radiology Ct Angio Head W Or Wo Contrast  Result Date: 11/25/2018 CLINICAL DATA:  History of dissection, now with headache and RIGHT-sided weakness. EXAM: CT ANGIOGRAPHY HEAD AND NECK TECHNIQUE: Multidetector CT imaging of the head and neck was performed using the standard protocol during bolus administration of intravenous contrast. Multiplanar CT image reconstructions and MIPs were obtained to evaluate the vascular anatomy. Carotid stenosis measurements (when applicable) are obtained utilizing NASCET criteria, using the distal internal carotid diameter as the denominator. CONTRAST:  164mL ISOVUE-370 IOPAMIDOL (ISOVUE-370) INJECTION 76% COMPARISON:  CTA head neck 07/22/2018. MR head 05/21/2018. MR C-spine 05/20/2018. FINDINGS: CT HEAD FINDINGS Brain: No evidence of acute infarction, hemorrhage, hydrocephalus, or convexity extra-axial fluid collection. Normal cerebral volume. No white matter disease. Incidental RIGHT skull base arachnoid cyst, just below the cerebellopontine angle, appears essentially unchanged from prior MR, when technique differences are considered, approximate cross-section of 15 x 17 mm. No significant mass  effect on the brainstem. Vascular: Reported separately. Skull: Calvarium intact.  Sinuses: Negative sinuses. Orbits: Unremarkable orbits. Review of the MIP images confirms the above findings CTA NECK FINDINGS Aortic arch: Standard branching. Imaged portion shows no evidence of aneurysm or dissection. No significant stenosis of the major arch vessel origins. Right carotid system: No evidence of dissection, stenosis (50% or greater) or occlusion. Left carotid system: No evidence of dissection, stenosis (50% or greater) or occlusion. Vertebral arteries: Codominant. The previously identified RIGHT V1 and V2 dissection has healed, without significant luminal irregularity or narrowing. Both vertebrals maintain patency to the basilar. Skeleton: Unremarkable. Other neck: Unremarkable. Upper chest: No mass or pneumothorax. Review of the MIP images confirms the above findings CTA HEAD FINDINGS Due to a scanner malfunction, the images through the head were truncated at the level of the corpus callosum. Overall study is felt to be diagnostic. Anterior circulation: No significant stenosis, proximal occlusion, aneurysm, or vascular malformation. Posterior circulation: No significant stenosis, proximal occlusion, aneurysm, or vascular malformation. Venous sinuses: As permitted by contrast timing, patent. Anatomic variants: None of significance. Delayed phase: No abnormal intracranial enhancement. Review of the MIP images confirms the above findings IMPRESSION: 1. Unremarkable CTA head and neck. No significant stenosis, dissection, or occlusion. 2. Previous RIGHT vertebral artery dissection has essentially healed. No recurrence of significant irregularity or stenosis. 3. Incidental estimated 1.5 cm diameter arachnoid cyst, just below the RIGHT cerebellopontine angle, essentially unchanged from prior MRI brain. Consider elective repeat MRI in 6-12 months. Electronically Signed   By: Staci Righter M.D.   On: 11/25/2018 19:02   Ct  Angio Neck W And/or Wo Contrast  Result Date: 11/25/2018 CLINICAL DATA:  History of dissection, now with headache and RIGHT-sided weakness. EXAM: CT ANGIOGRAPHY HEAD AND NECK TECHNIQUE: Multidetector CT imaging of the head and neck was performed using the standard protocol during bolus administration of intravenous contrast. Multiplanar CT image reconstructions and MIPs were obtained to evaluate the vascular anatomy. Carotid stenosis measurements (when applicable) are obtained utilizing NASCET criteria, using the distal internal carotid diameter as the denominator. CONTRAST:  154mL ISOVUE-370 IOPAMIDOL (ISOVUE-370) INJECTION 76% COMPARISON:  CTA head neck 07/22/2018. MR head 05/21/2018. MR C-spine 05/20/2018. FINDINGS: CT HEAD FINDINGS Brain: No evidence of acute infarction, hemorrhage, hydrocephalus, or convexity extra-axial fluid collection. Normal cerebral volume. No white matter disease. Incidental RIGHT skull base arachnoid cyst, just below the cerebellopontine angle, appears essentially unchanged from prior MR, when technique differences are considered, approximate cross-section of 15 x 17 mm. No significant mass effect on the brainstem. Vascular: Reported separately. Skull: Calvarium intact. Sinuses: Negative sinuses. Orbits: Unremarkable orbits. Review of the MIP images confirms the above findings CTA NECK FINDINGS Aortic arch: Standard branching. Imaged portion shows no evidence of aneurysm or dissection. No significant stenosis of the major arch vessel origins. Right carotid system: No evidence of dissection, stenosis (50% or greater) or occlusion. Left carotid system: No evidence of dissection, stenosis (50% or greater) or occlusion. Vertebral arteries: Codominant. The previously identified RIGHT V1 and V2 dissection has healed, without significant luminal irregularity or narrowing. Both vertebrals maintain patency to the basilar. Skeleton: Unremarkable. Other neck: Unremarkable. Upper chest: No mass or  pneumothorax. Review of the MIP images confirms the above findings CTA HEAD FINDINGS Due to a scanner malfunction, the images through the head were truncated at the level of the corpus callosum. Overall study is felt to be diagnostic. Anterior circulation: No significant stenosis, proximal occlusion, aneurysm, or vascular malformation. Posterior circulation: No significant stenosis, proximal occlusion, aneurysm, or vascular malformation. Venous sinuses: As permitted  by contrast timing, patent. Anatomic variants: None of significance. Delayed phase: No abnormal intracranial enhancement. Review of the MIP images confirms the above findings IMPRESSION: 1. Unremarkable CTA head and neck. No significant stenosis, dissection, or occlusion. 2. Previous RIGHT vertebral artery dissection has essentially healed. No recurrence of significant irregularity or stenosis. 3. Incidental estimated 1.5 cm diameter arachnoid cyst, just below the RIGHT cerebellopontine angle, essentially unchanged from prior MRI brain. Consider elective repeat MRI in 6-12 months. Electronically Signed   By: Staci Righter M.D.   On: 11/25/2018 19:02   Dg Chest Portable 1 View  Result Date: 11/25/2018 CLINICAL DATA:  Head and neck pain EXAM: PORTABLE CHEST 1 VIEW COMPARISON:  06/03/2018 FINDINGS: The heart size and mediastinal contours are within normal limits. Both lungs are clear. The visualized skeletal structures are unremarkable. IMPRESSION: No active disease. Electronically Signed   By: Donavan Foil M.D.   On: 11/25/2018 19:09    Procedures Procedures (including critical care time)  Medications Ordered in ED Medications  prochlorperazine (COMPAZINE) injection 10 mg (10 mg Intravenous Given 11/25/18 1720)  diphenhydrAMINE (BENADRYL) injection 25 mg (25 mg Intravenous Given 11/25/18 1720)  iopamidol (ISOVUE-370) 76 % injection (100 mLs  Contrast Given 11/25/18 1815)     Initial Impression / Assessment and Plan / ED Course  I have reviewed  the triage vital signs and the nursing notes.  Pertinent labs & imaging results that were available during my care of the patient were reviewed by me and considered in my medical decision making (see chart for details).     Sophia Costa is a 38 year old female history of rheumatoid arthritis, vertebral artery dissection who presents to the ED with headache, neck pain.  Patient with unremarkable vitals.  No fever.  Patient states that she has had mild headache for the last 4 to 5 days that acutely got worse this morning about 4 to 6 hours ago.  She states that it has made her feel lightheaded, she has had some blurred vision but denies any visual field deficits.  Patient has chronic weakness in the right upper extremity due to rheumatoid arthritis but denies any new weakness, sensation changes.  Patient is anxious because she has history of vertebral artery dissection but she states that last time she had it evaluated it appeared to be well-healing.  Patient with overall normal neurological exam.  She has 20/20 vision, no visual field deficit, normal speech, normal gait.  She has some effort dependent weakness in the right upper extremity that appears to be baseline.  It appears to be hindered as patient has pain in the right trapezius area.  She is tender in the paraspinal cervical muscles on the right as well as the right trapezius area.  She has normal sensation throughout otherwise.  It appears that patient likely has complex migraine versus muscle spasm versus possible intracranial process including head bleed/dissection given her history.  Will treat with Compazine, Benadryl.  Will get lab work including EKG, troponin, pregnancy test, CTA of the head and neck.  Patient with CTA of the head and neck that was overall unremarkable.  She had incidental arachnoid cyst that was seen on prior MRI.  She was made aware of this finding and need for possible repeat MRI in 6 to 12 months.  Previous dissection  has healed.  Patient with no significant anemia, electrolyte abnormality, urinary tract infection.  Troponin within normal limits.  EKG shows sinus rhythm.  Upon reevaluation patient states that symptoms  have improved a lot.  She states she has very mild headache at this time.  She has no issues with ambulation.  Neuro exam continues to be stable and fairly unremarkable.  She continues to have tenderness within the muscular region of the right side of her neck and trapezius.  Suspect that she possibly has some component of tension headache.  Recommend that she follow-up with her rheumatologist, neurologist.  At this time there is no concern for stroke.  No need for MRI as patient has normal neurological exam.  Does not appear that patient had a TIA.  Suspect likely musculoskeletal source for symptoms today.  Patient feels relieved after hearing her CT findings.  No concern for subarachnoid hemorrhage and patient with reassuring imaging.  Given return precautions and discharged from ED in good condition.  This chart was dictated using voice recognition software.  Despite best efforts to proofread,  errors can occur which can change the documentation meaning.   Final Clinical Impressions(s) / ED Diagnoses   Final diagnoses:  Nonintractable headache, unspecified chronicity pattern, unspecified headache type  Acute pain of right shoulder    ED Discharge Orders    None       Lennice Sites, DO 11/25/18 1951

## 2018-11-25 NOTE — ED Triage Notes (Signed)
Pt to ED with severe headache onset 4 days ago, right sided weakness, blurry vision, and dizziness onset mid morning today. Hx vertebral artery dissection.

## 2018-11-25 NOTE — ED Notes (Addendum)
Pt called out to use restroom for a second time. Pt was unable to get up this time, complaining of "feeling hot and not okay and sweaty". Pt does appear flushed, breathing heavily. RN notified.

## 2018-11-25 NOTE — Telephone Encounter (Signed)
Pt is having increased HA's and neck pain. She's had a HA for the past 2 days and she is unable to get it under control today. She is experiencing severe neck pain and some tingling in the left arm. Pt said she is "afraid" she could have another "dissection". Pt is not wanting to go to ED, she is wanting to see Janett Billow and have MRI scheduled for her without going to hospital. Please call to advise at [phone]

## 2018-11-25 NOTE — Telephone Encounter (Signed)
Noted  

## 2018-11-25 NOTE — ED Notes (Signed)
Pt verbalizes understanding of d/c instructions. Pt ambulatory at d/c with all belongings and with family.   

## 2018-11-25 NOTE — ED Notes (Signed)
Urine culture collected in addition to UA. No orders for cultures at this time. Culture located at [facility].

## 2018-11-25 NOTE — Addendum Note (Signed)
Addended by: Venancio Poisson on: 11/25/2018 03:40 PM   Modules accepted: Orders

## 2018-11-25 NOTE — ED Triage Notes (Signed)
Pt sent by her neurologist. Hx of vertebral artery dissection presenting with headache x 4 days and Right sided weakness that developed today at approx 1000. Pt is A&Ox4 with Right arm drift upon assessment. Ambulatory to registration desk.

## 2018-11-25 NOTE — ED Notes (Signed)
ED Provider at bedside. 

## 2018-11-26 ENCOUNTER — Other Ambulatory Visit: Payer: Self-pay | Admitting: Adult Health

## 2018-11-26 MED ORDER — CYCLOBENZAPRINE HCL 5 MG PO TABS: 5.0000 mg | ORAL_TABLET | Freq: Three times a day (TID) | ORAL | 0 refills | Status: AC | PRN

## 2018-11-26 NOTE — Telephone Encounter (Signed)
Venancio Poisson, NP at 11/26/2018 9:12 AM   Status: Signed    Call patient to follow-up on yesterday symptoms along with request of her husband to call him for an update on her hospital stay.  All imaging looked well without any evidence of recurrent dissection or abnormalities.  She did receive migraine cocktail with some improvement but does states she had a slight reaction after receiving Benadryl and Compazine.  After IV fluids, symptoms resolved.  This morning, she does continue to have slight headache and mild neck pain but overall has been improving.  Advise Jame and Randall Hiss (husband) that as all imaging has come back negative, this is most likely related to a tension headache and recommended continuation of Tylenol as needed for pain along with a short course of Flexeril 5 mg 3 times daily which was sent to the pharmacy.  It was also recommended to do neck exercises/stretches along with the use of moist heat.  Rick Duff to call on Monday to update on how her headache/neck pain is.  Advised her that if anything worsens or experiences any type of neurological changes over the weekend, to proceed to ED for further evaluation.  All questions answered.  Patient and husband appreciated phone call.

## 2018-11-26 NOTE — Telephone Encounter (Signed)
Pts husband Randall Hiss (on [place]) requesting a call stating he would like to touch base with NP regarding the pts hospital visit.

## 2018-11-26 NOTE — Progress Notes (Signed)
Call patient to follow-up on yesterday symptoms along with request of her husband to call him for an update on her hospital stay.  All imaging looked well without any evidence of recurrent dissection or abnormalities.  She did receive migraine cocktail with some improvement but does states she had a slight reaction after receiving Benadryl and Compazine.  After IV fluids, symptoms resolved.  This morning, she does continue to have slight headache and mild neck pain but overall has been improving.  Advise Miyah and Randall Hiss (husband) that as all imaging has come back negative, this is most likely related to a tension headache and recommended continuation of Tylenol as needed for pain along with a short course of Flexeril 5 mg 3 times daily which was sent to the pharmacy.  It was also recommended to do neck exercises/stretches along with the use of moist heat.  Rick Duff to call on Monday to update on how her headache/neck pain is.  Advised her that if anything worsens or experiences any type of neurological changes over the weekend, to proceed to ED for further evaluation.  All questions answered.  Patient and husband appreciated phone call.

## 2018-11-29 DIAGNOSIS — M0579 Rheumatoid arthritis with rheumatoid factor of multiple sites without organ or systems involvement: Secondary | ICD-10-CM | POA: Diagnosis not present

## 2018-11-29 DIAGNOSIS — R5382 Chronic fatigue, unspecified: Secondary | ICD-10-CM | POA: Diagnosis not present

## 2018-11-29 DIAGNOSIS — R319 Hematuria, unspecified: Secondary | ICD-10-CM | POA: Diagnosis not present

## 2018-11-29 NOTE — Telephone Encounter (Signed)
Please call patient to see if she has initiated Flexeril 5 mg 3 times daily as needed for continued neck pain.  Patient did state that she has used Flexeril in the past and due to daytime fatigue, she was only able to tolerate at nighttime.  If she is only taking at night, we can increase from 5 mg to 10 mg nightly.  Also ensure that she is using heat, Tylenol and neck exercises/stretches.

## 2018-11-29 NOTE — Telephone Encounter (Signed)
Received office note from patient's rheumatologist Dr. Leigh Aurora from today's appointment 11/29/2018.  Per notes, he does not believe her continued neck pain is related to her history of RA or recent flare.  It was recommended to follow-up with our office to determine if additional imaging was needed or follow-up imaging was needed in the setting of persistent symptoms.  He did recommend possible repeating MRI cervical spine if her symptoms persist along with possible need of MRA for assessment of her vasculature.  Spoke to patient in regards to her symptoms over the weekend.  She does state that her headaches have completely resolved but does continue to have right-sided neck pain.  This pain has been stable without any worsening.  She does state that she took Flexeril 5 mg Friday evening but over the weekend she will do her RA treatment where she will need to take Benadryl and was afraid of being too sleepy with a combination of Flexeril Benadryl.  She does plan on taking Flexeril tonight.  She has been continuing moist heat and neck exercises/stretches.  She denies any neurological symptoms such as numbness, tingling, radiating pain, weakness, dizziness, headache or visual changes.  Spoke to patient in regards to her symptoms most likely being due to initial tension headache with radiation into her neck/shoulder.  Advised her that at this time, no need for additional imaging from a neurological standpoint.  Did advise her that if she experiences any type of worsening or associated neurological symptoms (which were discussed), to notify our office or proceed to ED for further evaluation.  She did have MRI cervical spine in 04/2018 with her initial presentation of the symptoms without evidence of cervical spine issues but this imaging did show vertebral artery dissection.  She denies any type of trauma or injury.  Recommended at this time, we continue our current treatment regimen but advised her if she  continues to experience this neck pain into next week to call our office to be seen for further evaluation.  No further concerns at this time and all questions answered.  Patient appreciated phone call.

## 2018-11-29 NOTE — Telephone Encounter (Signed)
Pt requesting a call stating she saw her rheumatologist this morning as requested stating he will fax over all notes shortly. Also stating her head pain is better but her neck pain is still there. Rheumatologist feels this is not related to her RA. Please advise.

## 2018-11-29 NOTE — Telephone Encounter (Signed)
Rn call patient but she was available. Rn spoke with pts husband. Rn started explaining to husband about Janett Billow NP recommendations concerning pts call. The husband stated he was expecting a call from [facility] NP not the RN. RN stated Janett Billow NP sent the message to Rn to call back about recommendations. Rn stated Janett Billow NP recommend pt continue neck exercises and stretches and tylenol as needed. The husband stated pt is doing exercises daily. Rn also stated that Janett Billow NP recommend increasing flexeril 10mg  at night time. The husband stated the pt probably will not want to increase the flexeril to 10mg  because she has low tolerance to medication. The husband stated the rheumatologist thanks the pts head and neck pain is neurological not arthritis. The referring office will be faxing over recommendations and they stated pt may need a MRI or MRA of head and neck. The husband again stated I was hoping to speak with Janett Billow NP about this because she told us to call back and speak with her. Rn stated typically the RN for the provider calls the patient or family back. I stated Janett Billow NP is seeing pts today and requested the RN call back. Pts husband stated " I have  spoken with Janett Billow NP on Thursday and Friday, and have spoke with her". Rn stated JessicA NP will be sent message.  Rn stated once the report from Rheumatology is receive it will be given to [facility] NP.The husband verbalized understanding.

## 2018-12-22 ENCOUNTER — Other Ambulatory Visit: Payer: Self-pay | Admitting: Family Medicine

## 2019-01-03 DIAGNOSIS — R809 Proteinuria, unspecified: Secondary | ICD-10-CM | POA: Diagnosis not present

## 2019-01-03 DIAGNOSIS — R319 Hematuria, unspecified: Secondary | ICD-10-CM | POA: Diagnosis not present

## 2019-01-03 DIAGNOSIS — N182 Chronic kidney disease, stage 2 (mild): Secondary | ICD-10-CM | POA: Diagnosis not present

## 2019-01-03 LAB — CBC AND DIFFERENTIAL
HCT: 43 (ref 36–46)
Hemoglobin: 14.9 (ref 12.0–16.0)
Platelets: 237 (ref 150–399)
WBC: 7.3

## 2019-01-03 LAB — BASIC METABOLIC PANEL
BUN: 12 (ref 4–21)
Creatinine: 0.9 (ref <=1.1)
Glucose: 83
Potassium: 5.1 (ref 3.4–5.3)
Sodium: 138 (ref 137–147)

## 2019-01-04 ENCOUNTER — Encounter: Payer: Self-pay | Admitting: Adult Health

## 2019-01-17 DIAGNOSIS — D2261 Melanocytic nevi of right upper limb, including shoulder: Secondary | ICD-10-CM | POA: Diagnosis not present

## 2019-01-17 DIAGNOSIS — D2262 Melanocytic nevi of left upper limb, including shoulder: Secondary | ICD-10-CM | POA: Diagnosis not present

## 2019-01-17 DIAGNOSIS — D225 Melanocytic nevi of trunk: Secondary | ICD-10-CM | POA: Diagnosis not present

## 2019-01-24 ENCOUNTER — Ambulatory Visit: Payer: 59 | Admitting: Adult Health

## 2019-03-02 DIAGNOSIS — R5382 Chronic fatigue, unspecified: Secondary | ICD-10-CM | POA: Diagnosis not present

## 2019-03-02 DIAGNOSIS — R319 Hematuria, unspecified: Secondary | ICD-10-CM | POA: Diagnosis not present

## 2019-03-02 DIAGNOSIS — M0579 Rheumatoid arthritis with rheumatoid factor of multiple sites without organ or systems involvement: Secondary | ICD-10-CM | POA: Diagnosis not present

## 2019-07-03 ENCOUNTER — Other Ambulatory Visit: Payer: Self-pay | Admitting: Family Medicine

## 2019-07-06 ENCOUNTER — Telehealth (INDEPENDENT_AMBULATORY_CARE_PROVIDER_SITE_OTHER): Payer: 59 | Admitting: Family Medicine

## 2019-07-06 ENCOUNTER — Other Ambulatory Visit: Payer: Self-pay

## 2019-07-06 DIAGNOSIS — T753XXD Motion sickness, subsequent encounter: Secondary | ICD-10-CM | POA: Diagnosis not present

## 2019-07-06 DIAGNOSIS — I7774 Dissection of vertebral artery: Secondary | ICD-10-CM | POA: Diagnosis not present

## 2019-07-06 DIAGNOSIS — F339 Major depressive disorder, recurrent, unspecified: Secondary | ICD-10-CM | POA: Diagnosis not present

## 2019-07-06 MED ORDER — CITALOPRAM HYDROBROMIDE 20 MG PO TABS: 20.0000 mg | ORAL_TABLET | Freq: Every day | ORAL | 3 refills | Status: DC

## 2019-07-06 MED ORDER — SCOPOLAMINE 1 MG/3DAYS TD PT72: MEDICATED_PATCH | TRANSDERMAL | 1 refills | Status: AC

## 2019-07-06 NOTE — Progress Notes (Signed)
This visit type was conducted due to national recommendations for restrictions regarding the COVID-19 pandemic in an effort to limit this patient's exposure and mitigate transmission in our community.   Virtual Visit via Video Note  I connected with Sophia Costa on 07/06/19 at  1:30 PM EDT by a video enabled telemedicine application and verified that I am speaking with the correct person using two identifiers.  Location patient: home Location provider:work or home office Persons participating in the virtual visit: patient, provider  I discussed the limitations of evaluation and management by telemedicine and the availability of in person appointments. The patient expressed understanding and agreed to proceed.   HPI: Sophia Costa has history of recurrent depression which is currently stable Celexa 20 mg daily.  She has been on this for few years.  Reluctant to discontinue.  Requesting refills.  No side effects.  Her chronic problems include history of rheumatoid arthritis, vertebral artery dissection, and perennial allergies.  She is followed by neurologist yearly.  No recent headaches.  She had full healing from vertebral artery dissection and has had a couple of follow-up CTs since then which did not show any new problems.  She has tendency to have motion sickness and tentative trip planned to [place] in January.  She requests scopolamine which she has taken without issue in the past  Her rheumatoid arthritis is managed with Humira and methotrexate.  She realizes she has high risk of [id number] complications because of her immunosuppression.  She is taking great caution to avoid risk   ROS: See pertinent positives and negatives per HPI.  Past Medical History:  Diagnosis Date  . Allergy   . Arthritis   . Chronic kidney disease    microscopic hematuria  . Rheumatoid arthritis(714.0) 7/12    Past Surgical History:  Procedure Laterality Date  . CHOLECYSTECTOMY      Family History   Problem Relation Age of Onset  . Arthritis Mother   . Hypertension Mother   . Kidney disease Father   . Hypertension Father   . Kidney disease Sister        IgA nephropathy  . Hypertension Sister   . Arthritis Paternal Grandmother   . Rheum arthritis Paternal Grandmother   . Heart disease Paternal Grandfather   . Diabetes Paternal Grandfather   . Aneurysm Maternal Grandmother        brain    SOCIAL HX: Non-smoker.  She is currently staying at home.  Her husband is Arts administrator with city of [place].   Current Outpatient Medications:  .  acetaminophen (TYLENOL) 325 MG tablet, Take 650 mg by mouth every 6 (six) hours as needed., Disp: , Rfl:  .  Adalimumab (HUMIRA) 40 MG/0.4ML PSKT, Inject 40 mg into the skin every 14 (fourteen) days., Disp: , Rfl:  .  aspirin 325 MG tablet, Take 1 tablet (325 mg total) by mouth daily., Disp: 60 tablet, Rfl: 1 .  cetirizine (ZYRTEC) 10 MG tablet, Take 10 mg by mouth at bedtime. , Disp: , Rfl:  .  citalopram (CELEXA) 20 MG tablet, Take 1 tablet (20 mg total) by mouth at bedtime., Disp: 90 tablet, Rfl: 3 .  colestipol (COLESTID) 1 g tablet, Take 2 g by mouth daily., Disp: , Rfl: 3 .  cyclobenzaprine (FLEXERIL) 5 MG tablet, Take 1 tablet (5 mg total) by mouth 3 (three) times daily as needed for muscle spasms., Disp: 15 tablet, Rfl: 0 .  folic acid (FOLVITE) 1 MG tablet, Take 2 mg by mouth  at bedtime. , Disp: , Rfl:  .  methotrexate 50 MG/2ML injection, Inject 10 mg into the skin See admin instructions. Inject 10 mg once a week every Friday at bedtime, Disp: , Rfl:  .  ondansetron (ZOFRAN ODT) 8 MG disintegrating tablet, Take 1 tablet (8 mg total) by mouth every 8 (eight) hours as needed for nausea or vomiting. (Patient not taking: Reported on 07/20/2018), Disp: 20 tablet, Rfl: 0 .  predniSONE (DELTASONE) 10 MG tablet, Take 10 mg by mouth 2 (two) times daily as needed (for R.A. flares)., Disp: , Rfl:  .  scopolamine (TRANSDERM-SCOP) 1 MG/3DAYS, 1 patch  behind ear every 72 hours PRN to prevent motion sickness., Disp: 10 patch, Rfl: 1 .  traMADol (ULTRAM) 50 MG tablet, Take 50 mg by mouth every 6 (six) hours as needed (for pain)., Disp: , Rfl:   EXAM:  VITALS per patient if applicable:  GENERAL: alert, oriented, appears well and in no acute distress  HEENT: atraumatic, conjunttiva clear, no obvious abnormalities on inspection of external nose and ears  NECK: normal movements of the head and neck  LUNGS: on inspection no signs of respiratory distress, breathing rate appears normal, no obvious gross SOB, gasping or wheezing  CV: no obvious cyanosis  MS: moves all visible extremities without noticeable abnormality  PSYCH/NEURO: pleasant and cooperative, no obvious depression or anxiety, speech and thought processing grossly intact  ASSESSMENT AND PLAN:  Discussed the following assessment and plan:  #1 history of recurrent depression currently stable on Celexa 20 mg daily  -Refill Celexa for 1 year  #2 history of recurrent motion sickness  -Refills for scopolamine patch 1 behind the ear every 72 hours as needed for prevention  #3 history of vertebral artery dissection.  Patient denies any recent headaches  -She remains on daily aspirin.  Continue close follow-up with neurology     I discussed the assessment and treatment plan with the patient. The patient was provided an opportunity to ask questions and all were answered. The patient agreed with the plan and demonstrated an understanding of the instructions.   The patient was advised to call back or seek an in-person evaluation if the symptoms worsen or if the condition fails to improve as anticipated.    Carolann Littler, MD

## 2019-08-16 IMAGING — MR MR CERVICAL SPINE W/O CM
5 series · 29 of 48 positions shown · non-contrast
Comparison: Prior head CT from 05/09/2009

ADDENDUM:
Findings were discussed by telephone with Dr. Pebrase at
approximately [DATE] p.m. on 05/20/2018. Patient to be instructed to
go to the ER for further workup and evaluation.
CLINICAL DATA: Initial evaluation for acute neck pain and stiffness
for 9 days, worse on the right.

EXAM:
MRI CERVICAL SPINE WITHOUT CONTRAST
TECHNIQUE: Multiplanar, multisequence MR imaging of the cervical spine was
performed. No intravenous contrast was administered.

[Series 2: T2 · sagittal · 3.0mm · 0.41mm/px · 6 of 16 slices shown (1 of 2)]
[im 1/16]
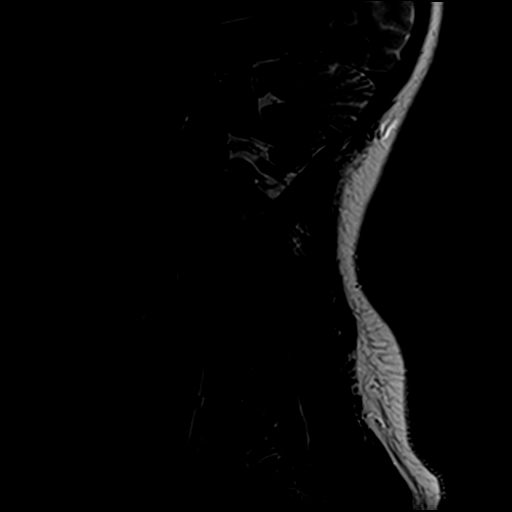
[im 4/16]
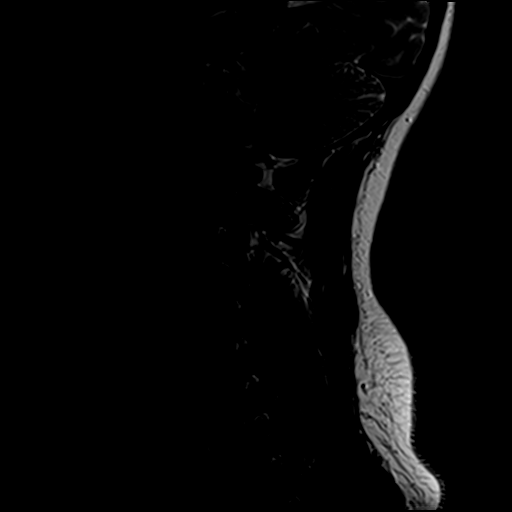
[im 7/16]
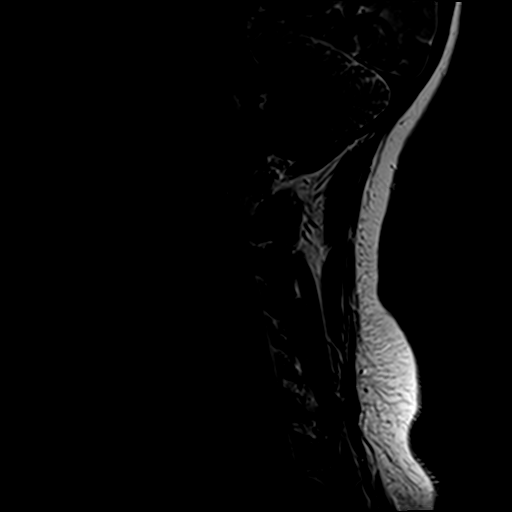
[im 10/16]
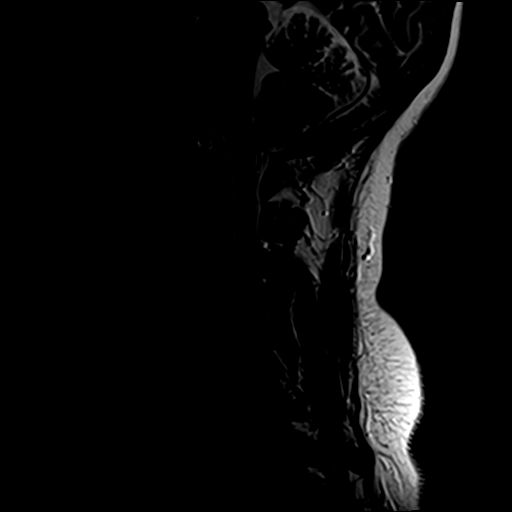
[im 13/16]
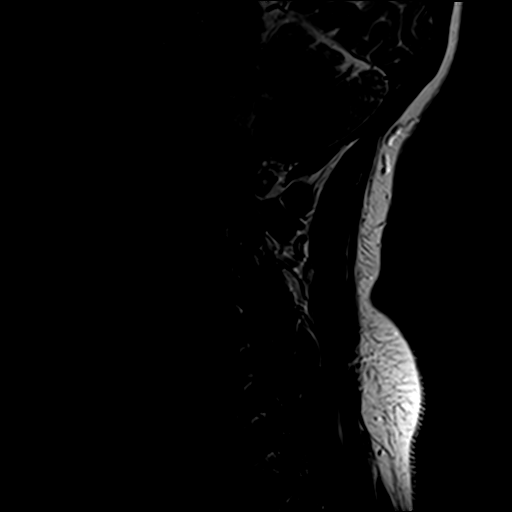
[im 16/16]
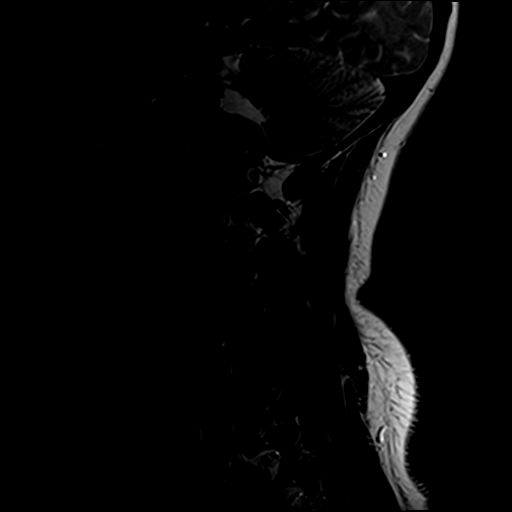

[Series 3: STIR · sagittal · 3.0mm · 0.82mm/px · 7 of 16 slices shown]
[im 1/16]
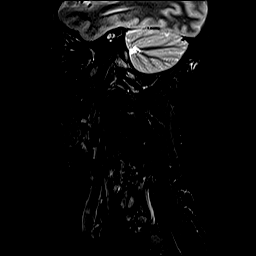
[im 3/16]
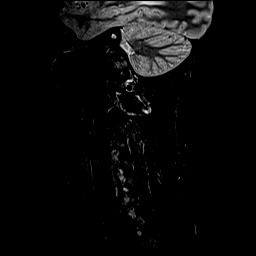
[im 6/16]
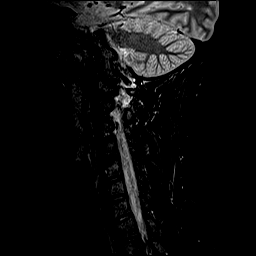
[im 8/16]
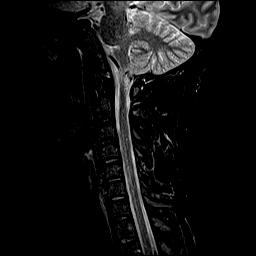
[im 11/16]
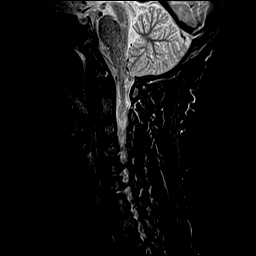
[im 13/16]
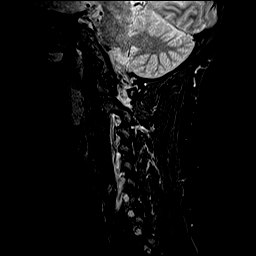
[im 16/16]
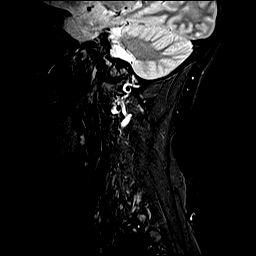

[Series 4: T1 · sagittal · 3.0mm · 0.41mm/px · 7 of 16 slices shown]
[im 1/16]
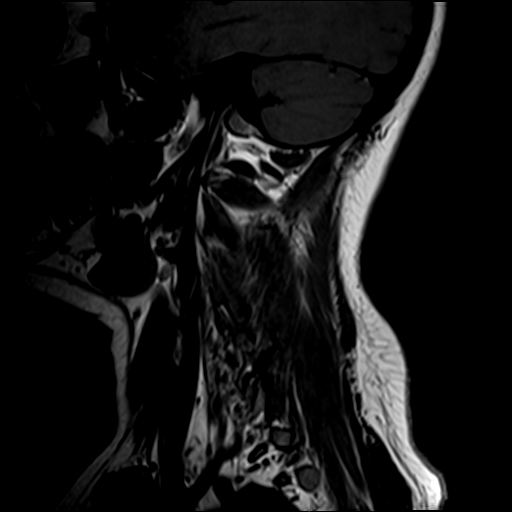
[im 3/16]
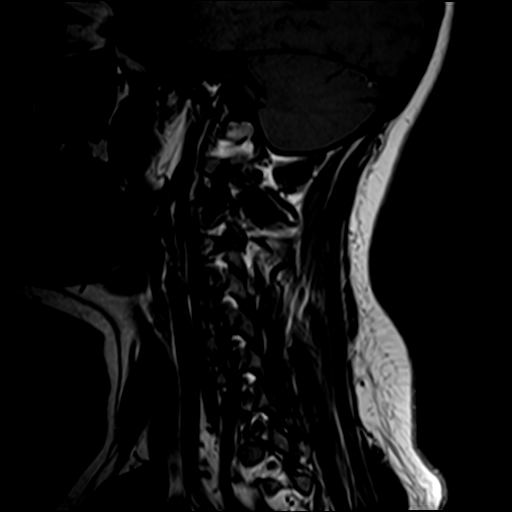
[im 6/16]
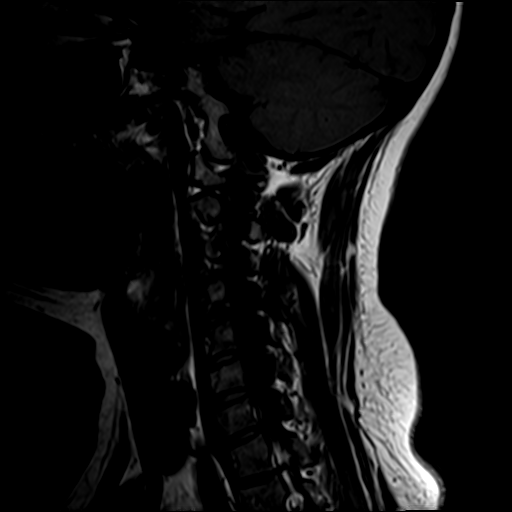
[im 8/16]
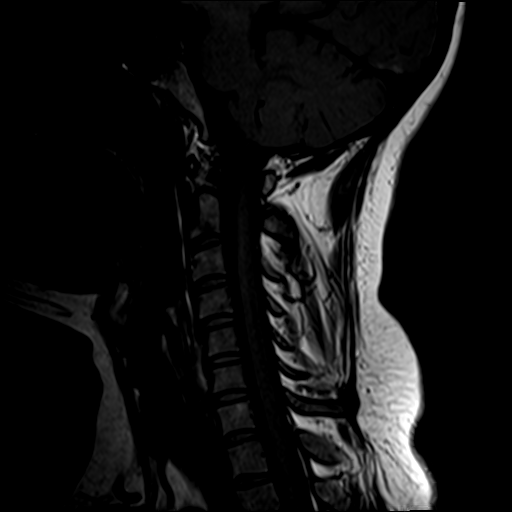
[im 11/16]
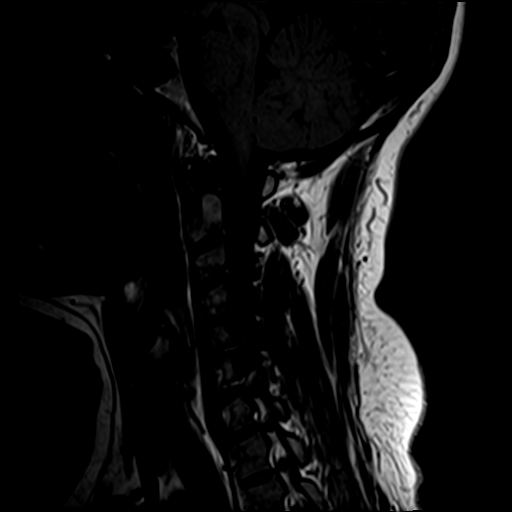
[im 13/16]
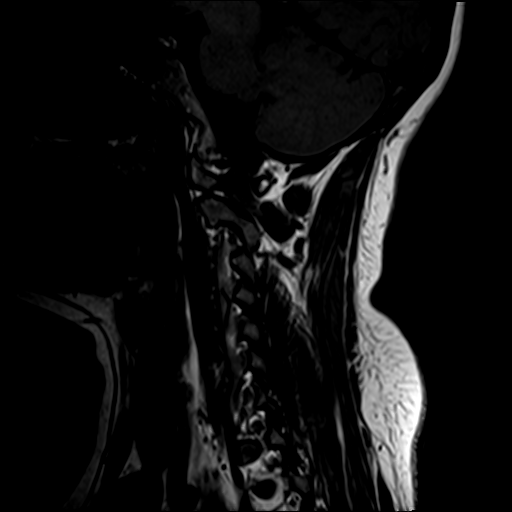
[im 16/16]
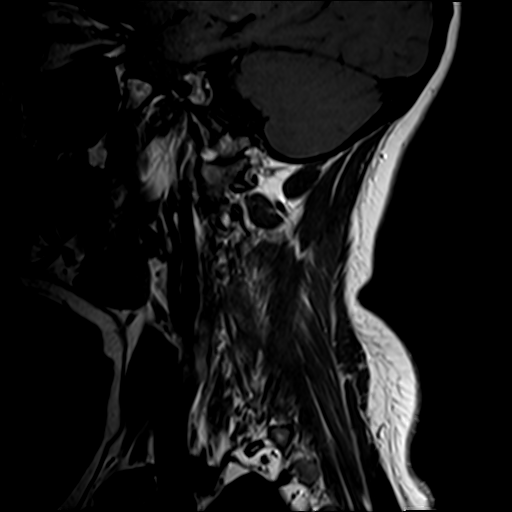

[Series 5: T2 · axial · 3.0mm · 0.70mm/px · z∈[-102,+19]mm · 8 of 33 slices shown (2 of 2)]
[im 1/33]
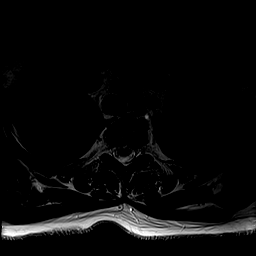
[im 5/33]
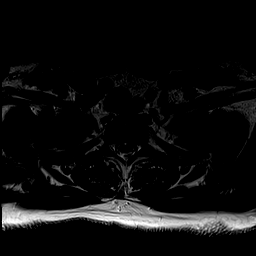
[im 10/33]
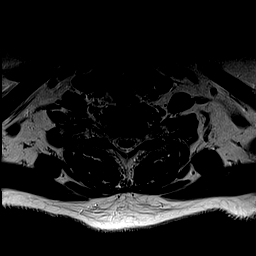
[im 15/33]
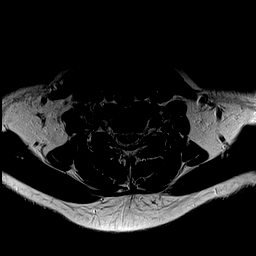
[im 18/33]
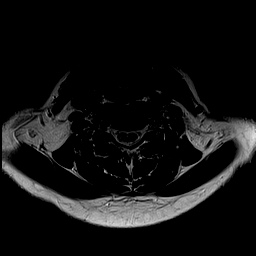
[im 23/33]
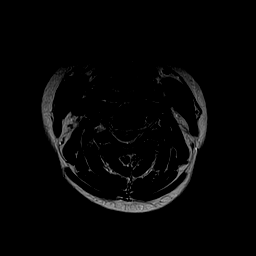
[im 28/33]
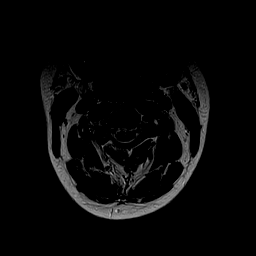
[im 33/33]
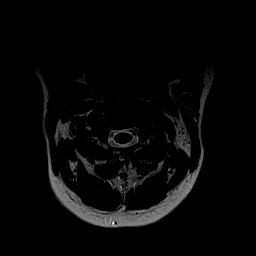

[Series 6: GRE · axial · 3.0mm · 0.35mm/px · 1 of 33 slices shown]
[im 1/33]
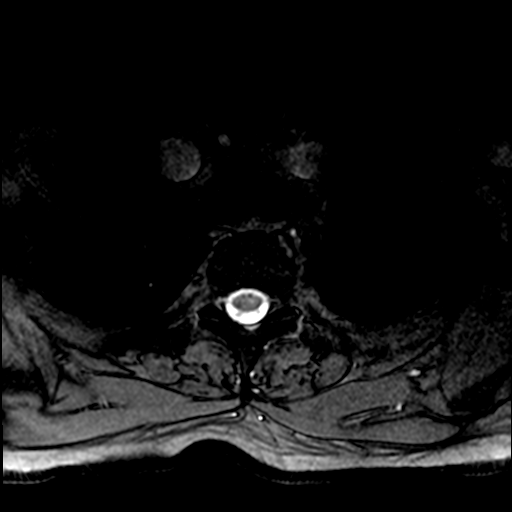

[29 of 48 positions shown; findings below may reference images not displayed]

FINDINGS: Alignment: Straightening of the normal cervical lordosis. No
listhesis.

Vertebrae: Vertebral body heights well maintained without evidence
for acute or chronic fracture. Bone marrow signal intensity within
normal limits. No discrete or worrisome osseous lesions. No abnormal
marrow edema.

Cord: Signal intensity within the cervical spinal cord is normal.
Normal cord caliber and morphology.

Posterior Fossa, vertebral arteries, paraspinal tissues: Benign
arachnoid cyst at the right CP angle cistern partially visualize,
grossly similar from previous. Visualized brain and posterior fossa
otherwise unremarkable. Craniocervical junction within normal
limits. Paraspinous and prevertebral soft tissues within normal
limits. Irregular flow void within the right vertebral artery
(series 5, image 7), of uncertain significance, but raises the
possibility for acute vascular abnormality/dissection. Normal
intravascular flow voids present within the left vertebral artery.

Disc levels:

No significant disc pathology within the cervical spine.
Intervertebral discs well hydrated with preserved disc height. No
disc bulge or disc protrusion. No significant facet disease. No
canal or foraminal stenosis.
IMPRESSION: 1. Irregular flow void within the right vertebral artery, of
uncertain chronicity or significance, but could reflect an acute
vascular injury/dissection in the setting of acute neck pain.
Further evaluation with CTA recommended.
2. Otherwise unremarkable MRI of the cervical spine. No other acute
abnormality identified.

Attempt is currently being made to contact the ordering clinician.
Results will be conveyed as soon as possible.

## 2019-09-08 ENCOUNTER — Ambulatory Visit: Payer: 59

## 2020-06-29 ENCOUNTER — Other Ambulatory Visit: Payer: Self-pay | Admitting: Family Medicine

## 2020-08-01 ENCOUNTER — Other Ambulatory Visit: Payer: Self-pay | Admitting: Family Medicine

## 2020-09-06 ENCOUNTER — Other Ambulatory Visit: Payer: Self-pay | Admitting: Family Medicine

## 2020-09-26 ENCOUNTER — Encounter: Payer: Self-pay | Admitting: Family Medicine

## 2020-09-26 ENCOUNTER — Other Ambulatory Visit: Payer: Self-pay

## 2020-09-26 ENCOUNTER — Ambulatory Visit (INDEPENDENT_AMBULATORY_CARE_PROVIDER_SITE_OTHER): Payer: 59 | Admitting: Family Medicine

## 2020-09-26 VITALS — BP 116/68 | HR 66 | Temp 98.6°F | Ht 64.0 in | Wt 187.7 lb

## 2020-09-26 DIAGNOSIS — Z23 Encounter for immunization: Secondary | ICD-10-CM

## 2020-09-26 DIAGNOSIS — F339 Major depressive disorder, recurrent, unspecified: Secondary | ICD-10-CM | POA: Diagnosis not present

## 2020-09-26 MED ORDER — CITALOPRAM HYDROBROMIDE 20 MG PO TABS
20.0000 mg | ORAL_TABLET | Freq: Every day | ORAL | 3 refills | Status: DC
Start: 2020-09-26 — End: 2021-09-30

## 2020-09-26 NOTE — Progress Notes (Signed)
Established Patient Office Visit  Subjective:  Patient ID: Sophia Costa, female    DOB: 12/06/1980  Age: 39 y.o. MRN: [id number]  CC:  Chief Complaint  Patient presents with  . med check    HPI Sophia Costa presents for follow-up regarding recurrent depression.  She has been on citalopram for several years 20 mg daily and this seems to be working well.  She has been reluctant to discontinue.  She has history of rheumatoid arthritis and is followed actively by rheumatology.  She is on Humira and also takes methotrexate.  She gets regular lab work through rheumatology.  She has had Covid vaccine but needs flu vaccine.  Her depression symptoms are stable.  Her husband is a Airline pilot and she and her husband have been able to travel fairly extensively throughout the [place] over the past several years  Past Medical History:  Diagnosis Date  . Allergy   . Arthritis   . Chronic kidney disease    microscopic hematuria  . Rheumatoid arthritis(714.0) 7/12    Past Surgical History:  Procedure Laterality Date  . CHOLECYSTECTOMY      Family History  Problem Relation Age of Onset  . Arthritis Mother   . Hypertension Mother   . Kidney disease Father   . Hypertension Father   . Kidney disease Sister        IgA nephropathy  . Hypertension Sister   . Arthritis Paternal Grandmother   . Rheum arthritis Paternal Grandmother   . Heart disease Paternal Grandfather   . Diabetes Paternal Grandfather   . Aneurysm Maternal Grandmother        brain    Social History   Socioeconomic History  . Marital status: Married    Spouse name: Not on file  . Number of children: Not on file  . Years of education: Not on file  . Highest education level: Not on file  Occupational History  . Not on file  Tobacco Use  . Smoking status: Never Smoker  . Smokeless tobacco: Never Used  Vaping Use  . Vaping Use: Never used  Substance and Sexual Activity  . Alcohol use: Yes    Comment: occ  .  Drug use: No  . Sexual activity: Not on file  Other Topics Concern  . Not on file  Social History Narrative  . Not on file   Social Determinants of Health   Financial Resource Strain:   . Difficulty of Paying Living Expenses: Not on file  Food Insecurity:   . Worried About Charity fundraiser in the Last Year: Not on file  . Ran Out of Food in the Last Year: Not on file  Transportation Needs:   . Lack of Transportation (Medical): Not on file  . Lack of Transportation (Non-Medical): Not on file  Physical Activity:   . Days of Exercise per Week: Not on file  . Minutes of Exercise per Session: Not on file  Stress:   . Feeling of Stress : Not on file  Social Connections:   . Frequency of Communication with Friends and Family: Not on file  . Frequency of Social Gatherings with Friends and Family: Not on file  . Attends Religious Services: Not on file  . Active Member of Clubs or Organizations: Not on file  . Attends Archivist Meetings: Not on file  . Marital Status: Not on file  Intimate Partner Violence:   . Fear of Current or Ex-Partner: Not on file  .  Emotionally Abused: Not on file  . Physically Abused: Not on file  . Sexually Abused: Not on file    Outpatient Medications Prior to Visit  Medication Sig Dispense Refill  . acetaminophen (TYLENOL) 325 MG tablet Take 650 mg by mouth every 6 (six) hours as needed.    . Adalimumab (HUMIRA) 40 MG/0.4ML PSKT Inject 40 mg into the skin every 14 (fourteen) days.    Marland Kitchen aspirin 325 MG tablet Take 1 tablet (325 mg total) by mouth daily. 60 tablet 1  . cetirizine (ZYRTEC) 10 MG tablet Take 10 mg by mouth at bedtime.     . folic acid (FOLVITE) 1 MG tablet Take 2 mg by mouth at bedtime.     . methotrexate 50 MG/2ML injection Inject 10 mg into the skin See admin instructions. Inject 10 mg once a week every Friday at bedtime    . predniSONE (DELTASONE) 10 MG tablet Take 10 mg by mouth 2 (two) times daily as needed (for R.A.  flares).    . traMADol (ULTRAM) 50 MG tablet Take 50 mg by mouth every 6 (six) hours as needed (for pain).    . citalopram (CELEXA) 20 MG tablet TAKE 1 TABLET (20 MG TOTAL) BY MOUTH DAILY. NEEDS OFFICE VISIT FOR FURTHER REFILLS 15 tablet 0  . colestipol (COLESTID) 1 g tablet Take 2 g by mouth daily. (Patient not taking: Reported on 09/26/2020)  3  . cyclobenzaprine (FLEXERIL) 5 MG tablet Take 1 tablet (5 mg total) by mouth 3 (three) times daily as needed for muscle spasms. (Patient not taking: Reported on 09/26/2020) 15 tablet 0  . scopolamine (TRANSDERM-SCOP) 1 MG/3DAYS 1 patch behind ear every 72 hours PRN to prevent motion sickness. (Patient not taking: Reported on 09/26/2020) 10 patch 1  . ondansetron (ZOFRAN ODT) 8 MG disintegrating tablet Take 1 tablet (8 mg total) by mouth every 8 (eight) hours as needed for nausea or vomiting. (Patient not taking: Reported on 07/20/2018) 20 tablet 0   No facility-administered medications prior to visit.    Allergies  Allergen Reactions  . Prednisone Shortness Of Breath    Reacted to a dose greater than 10 mg (1st day of a taper pack)    ROS Review of Systems  Constitutional: Negative for appetite change and unexpected weight change.  Psychiatric/Behavioral: Negative for dysphoric mood.      Objective:    Physical Exam Vitals reviewed.  Constitutional:      Appearance: Normal appearance.  Cardiovascular:     Rate and Rhythm: Normal rate and regular rhythm.  Pulmonary:     Effort: Pulmonary effort is normal.     Breath sounds: Normal breath sounds.  Neurological:     Mental Status: She is alert.     BP 116/68 (BP Location: Left Arm, Patient Position: Sitting, Cuff Size: Normal)   Pulse 66   Temp 98.6 F (37 C) (Oral)   Ht 5\' 4"  (1.626 m)   Wt 187 lb 11.2 oz (85.1 kg)   SpO2 98%   BMI 32.22 kg/m  Wt Readings from Last 3 Encounters:  09/26/20 187 lb 11.2 oz (85.1 kg)  11/25/18 170 lb (77.1 kg)  07/20/18 173 lb 9.6 oz (78.7 kg)      Health Maintenance Due  Topic Date Due  . Hepatitis C Screening  Never done  . COVID-19 Vaccine (1) Never done  . TETANUS/TDAP  Never done  . PAP SMEAR-Modifier  Never done    There are no preventive care reminders to  display for this patient.  Lab Results  Component Value Date   TSH 1.239 07/02/2012   Lab Results  Component Value Date   WBC 7.3 01/03/2019   HGB 14.9 01/03/2019   HCT 43 01/03/2019   MCV 94.4 11/25/2018   PLT 237 01/03/2019   Lab Results  Component Value Date   NA 138 01/03/2019   K 5.1 01/03/2019   CO2 28 11/25/2018   GLUCOSE 96 11/25/2018   BUN 12 01/03/2019   CREATININE 0.9 01/03/2019   BILITOT 0.7 11/25/2018   ALKPHOS 62 11/25/2018   AST 25 11/25/2018   ALT 23 11/25/2018   PROT 8.1 11/25/2018   ALBUMIN 4.6 11/25/2018   CALCIUM 9.7 11/25/2018   ANIONGAP 9 11/25/2018   GFR 84.18 09/22/2016   Lab Results  Component Value Date   CHOL 142 05/21/2018   Lab Results  Component Value Date   HDL 60 05/21/2018   Lab Results  Component Value Date   LDLCALC 65 05/21/2018   Lab Results  Component Value Date   TRIG 86 05/21/2018   Lab Results  Component Value Date   CHOLHDL 2.4 05/21/2018   Lab Results  Component Value Date   HGBA1C 5.0 05/21/2018      Assessment & Plan:   History of recurrent depression currently stable on citalopram 20 mg daily  -Refill citalopram for 1 year -Flu vaccine given  Meds ordered this encounter  Medications  . citalopram (CELEXA) 20 MG tablet    Sig: Take 1 tablet (20 mg total) by mouth daily.    Dispense:  90 tablet    Refill:  3    Follow-up: Return in about 1 year (around 09/26/2021).    Carolann Littler, MD

## 2020-09-27 ENCOUNTER — Telehealth: Payer: Self-pay | Admitting: Family Medicine

## 2020-09-27 NOTE — Telephone Encounter (Signed)
Called spoke with pt she said that she woke up this morning with body aches, she said that she has gotten the vaccine in the past.And never had body aches before after flu shot. Pt wanted to know if this is normal. Informed pt that this is normal that she can  take otc Tylenol. Advised the pt if she starts to have sob, fever or sever reaction call us or ED,

## 2020-09-27 NOTE — Telephone Encounter (Signed)
Pt call and stated she want a call back  To talk about the way she is  feeling after taken the flu shot.

## 2020-12-17 ENCOUNTER — Telehealth (INDEPENDENT_AMBULATORY_CARE_PROVIDER_SITE_OTHER): Payer: 59 | Admitting: Family Medicine

## 2020-12-17 DIAGNOSIS — J329 Chronic sinusitis, unspecified: Secondary | ICD-10-CM

## 2020-12-17 MED ORDER — AMOXICILLIN-POT CLAVULANATE 875-125 MG PO TABS: 1.0000 | ORAL_TABLET | Freq: Two times a day (BID) | ORAL | 0 refills | Status: DC

## 2020-12-17 MED ORDER — PREDNISONE 10 MG PO TABS: ORAL_TABLET | ORAL | 0 refills | Status: DC

## 2020-12-17 NOTE — Progress Notes (Signed)
Patient ID: Sophia Costa, female   DOB: [birth year], 39 y.o.   MRN: [id number]   This visit type was conducted due to national recommendations for restrictions regarding the COVID-19 pandemic in an effort to limit this patient's exposure and mitigate transmission in our community.   Virtual Visit via Video Note  I connected with Sophia Costa on 12/17/20 at  4:30 PM EST by a video enabled telemedicine application and verified that I am speaking with the correct person using two identifiers.  Location patient: home Location provider:work or home office Persons participating in the virtual visit: patient, provider  I discussed the limitations of evaluation and management by telemedicine and the availability of in person appointments. The patient expressed understanding and agreed to proceed.   HPI: Sophia Costa has history of rheumatoid arthritis and is on Humira.  She consults today with recurrent sinus issues.  She states she is getting about 3-4 severe sinus infections per year.  She had infection back in August which was eventually treated with prednisone and antibiotics and did gradually improve.  She relates over 1 week history now of sinus pressure especially maxillary sinuses bilaterally.  She had some frequent daily headaches and upper teeth pain.  Bloody and yellowish nasal discharge for the past week.  Increased malaise.  No fever.  No cough.  No dyspnea.  Husband is asymptomatic.  She has had a couple of recent Covid test that were negative.  She does have history of some allergies and has been very diligent with allergy medications taking Zyrtec and Flonase daily and also Nettie pot irrigation daily.  She has seen allergist for testing in the past.  They did not feel they had much else to offer.  She states that she has to come off her Humira each time she goes on antibiotics and that has made her management of rheumatoid disease very challenging.  She is interested in whether there may be  further preventatives.  She does try to stay well-hydrated.   ROS: See pertinent positives and negatives per HPI.  Past Medical History:  Diagnosis Date  . Allergy   . Arthritis   . Chronic kidney disease    microscopic hematuria  . Rheumatoid arthritis(714.0) 7/12    Past Surgical History:  Procedure Laterality Date  . CHOLECYSTECTOMY      Family History  Problem Relation Age of Onset  . Arthritis Mother   . Hypertension Mother   . Kidney disease Father   . Hypertension Father   . Kidney disease Sister        IgA nephropathy  . Hypertension Sister   . Arthritis Paternal Grandmother   . Rheum arthritis Paternal Grandmother   . Heart disease Paternal Grandfather   . Diabetes Paternal Grandfather   . Aneurysm Maternal Grandmother        brain    SOCIAL HX: Non-smoker   Current Outpatient Medications:  .  amoxicillin-clavulanate (AUGMENTIN) 875-125 MG tablet, Take 1 tablet by mouth 2 (two) times daily., Disp: 20 tablet, Rfl: 0 .  predniSONE (DELTASONE) 10 MG tablet, Taper as follows: 3-3-3-2-2-1-1, Disp: 15 tablet, Rfl: 0 .  acetaminophen (TYLENOL) 325 MG tablet, Take 650 mg by mouth every 6 (six) hours as needed., Disp: , Rfl:  .  Adalimumab (HUMIRA) 40 MG/0.4ML PSKT, Inject 40 mg into the skin every 14 (fourteen) days., Disp: , Rfl:  .  aspirin 325 MG tablet, Take 1 tablet (325 mg total) by mouth daily., Disp: 60 tablet, Rfl: 1 .  cetirizine (ZYRTEC) 10 MG tablet, Take 10 mg by mouth at bedtime. , Disp: , Rfl:  .  citalopram (CELEXA) 20 MG tablet, Take 1 tablet (20 mg total) by mouth daily., Disp: 90 tablet, Rfl: 3 .  colestipol (COLESTID) 1 g tablet, Take 2 g by mouth daily. (Patient not taking: Reported on 09/26/2020), Disp: , Rfl: 3 .  cyclobenzaprine (FLEXERIL) 5 MG tablet, Take 1 tablet (5 mg total) by mouth 3 (three) times daily as needed for muscle spasms. (Patient not taking: Reported on 09/26/2020), Disp: 15 tablet, Rfl: 0 .  folic acid (FOLVITE) 1 MG tablet,  Take 2 mg by mouth at bedtime. , Disp: , Rfl:  .  methotrexate 50 MG/2ML injection, Inject 10 mg into the skin See admin instructions. Inject 10 mg once a week every Friday at bedtime, Disp: , Rfl:  .  predniSONE (DELTASONE) 10 MG tablet, Take 10 mg by mouth 2 (two) times daily as needed (for R.A. flares)., Disp: , Rfl:  .  scopolamine (TRANSDERM-SCOP) 1 MG/3DAYS, 1 patch behind ear every 72 hours PRN to prevent motion sickness. (Patient not taking: Reported on 09/26/2020), Disp: 10 patch, Rfl: 1 .  traMADol (ULTRAM) 50 MG tablet, Take 50 mg by mouth every 6 (six) hours as needed (for pain)., Disp: , Rfl:   EXAM:  VITALS per patient if applicable:  GENERAL: alert, oriented, appears well and in no acute distress  HEENT: atraumatic, conjunttiva clear, no obvious abnormalities on inspection of external nose and ears  NECK: normal movements of the head and neck  LUNGS: on inspection no signs of respiratory distress, breathing rate appears normal, no obvious gross SOB, gasping or wheezing  CV: no obvious cyanosis  MS: moves all visible extremities without noticeable abnormality  PSYCH/NEURO: pleasant and cooperative, no obvious depression or anxiety, speech and thought processing grossly intact  ASSESSMENT AND PLAN:  Discussed the following assessment and plan:  Recurrent sinusitis - Plan: Ambulatory referral to ENT   -Patient relates multiple episodes per year of severe recurrent sinusitis.  Will start Augmentin 875 mg twice daily with food for 10 days -Send in low-dose prednisone to take for the next week -Set up ENT referral -Stressed importance of good hydration -Continue current allergy medications for now.     I discussed the assessment and treatment plan with the patient. The patient was provided an opportunity to ask questions and all were answered. The patient agreed with the plan and demonstrated an understanding of the instructions.   The patient was advised to call back  or seek an in-person evaluation if the symptoms worsen or if the condition fails to improve as anticipated.     Carolann Littler, MD

## 2020-12-28 ENCOUNTER — Telehealth: Payer: Self-pay | Admitting: Family Medicine

## 2020-12-28 MED ORDER — FLUCONAZOLE 150 MG PO TABS: 150.0000 mg | ORAL_TABLET | Freq: Once | ORAL | 0 refills | Status: AC

## 2020-12-28 NOTE — Telephone Encounter (Signed)
Pt call and stated she need Diflucan call in for yeast infection at  [id number] - [place], [place] Phone:  [phone]  Fax:  [phone]

## 2020-12-28 NOTE — Telephone Encounter (Signed)
Ok to send in Fluconazole 150 mg po times one dose.

## 2020-12-28 NOTE — Telephone Encounter (Signed)
Refill sent, left message informing patient.

## 2021-03-11 ENCOUNTER — Ambulatory Visit: Payer: 59 | Admitting: Family Medicine

## 2021-03-11 ENCOUNTER — Other Ambulatory Visit: Payer: Self-pay

## 2021-03-11 ENCOUNTER — Encounter: Payer: Self-pay | Admitting: Family Medicine

## 2021-03-11 VITALS — BP 120/70 | HR 91 | Temp 97.9°F | Wt 181.5 lb

## 2021-03-11 DIAGNOSIS — R1030 Lower abdominal pain, unspecified: Secondary | ICD-10-CM

## 2021-03-11 DIAGNOSIS — R300 Dysuria: Secondary | ICD-10-CM

## 2021-03-11 LAB — POCT URINALYSIS DIPSTICK
Bilirubin, UA: NEGATIVE
Blood, UA: POSITIVE
Glucose, UA: NEGATIVE
Ketones, UA: POSITIVE
Leukocytes, UA: NEGATIVE
Nitrite, UA: NEGATIVE
Protein, UA: NEGATIVE
Spec Grav, UA: >=1.03 — AB (ref 1.010–1.025)
Urobilinogen, UA: 0.2 E.U./dL
pH, UA: 6 (ref 5.0–8.0)

## 2021-03-11 NOTE — Patient Instructions (Signed)
Abdominal Pain, Adult Pain in the abdomen (abdominal pain) can be caused by many things. Often, abdominal pain is not serious and it gets better with no treatment or by being treated at home. However, sometimes abdominal pain is serious. Your health care provider will ask questions about your medical history and do a physical exam to try to determine the cause of your abdominal pain. Follow these instructions at home: Medicines  Take over-the-counter and prescription medicines only as told by your health care provider.  Do not take a laxative unless told by your health care provider. General instructions  Watch your condition for any changes.  Drink enough fluid to keep your urine pale yellow.  Keep all follow-up visits as told by your health care provider. This is important.   Contact a health care provider if:  Your abdominal pain changes or gets worse.  You are not hungry or you lose weight without trying.  You are constipated or have diarrhea for more than 2-3 days.  You have pain when you urinate or have a bowel movement.  Your abdominal pain wakes you up at night.  Your pain gets worse with meals, after eating, or with certain foods.  You are vomiting and cannot keep anything down.  You have a fever.  You have blood in your urine. Get help right away if:  Your pain does not go away as soon as your health care provider told you to expect.  You cannot stop vomiting.  Your pain is only in areas of the abdomen, such as the right side or the left lower portion of the abdomen. Pain on the right side could be caused by appendicitis.  You have bloody or black stools, or stools that look like tar.  You have severe pain, cramping, or bloating in your abdomen.  You have signs of dehydration, such as: ? Dark urine, very little urine, or no urine. ? Cracked lips. ? Dry mouth. ? Sunken eyes. ? Sleepiness. ? Weakness.  You have trouble breathing or chest  pain. Summary  Often, abdominal pain is not serious and it gets better with no treatment or by being treated at home. However, sometimes abdominal pain is serious.  Watch your condition for any changes.  Take over-the-counter and prescription medicines only as told by your health care provider.  Contact a health care provider if your abdominal pain changes or gets worse.  Get help right away if you have severe pain, cramping, or bloating in your abdomen. This information is not intended to replace advice given to you by your health care provider. Make sure you discuss any questions you have with your health care provider. Document Revised: 12/30/2019 Document Reviewed: 03/21/2019 Elsevier Patient Education  [employer].

## 2021-03-11 NOTE — Progress Notes (Signed)
Established Patient Office Visit  Subjective:  Patient ID: Sophia Costa, female    DOB: 1981-01-14  Age: 40 y.o. MRN: [id number]  CC:  Chief Complaint  Patient presents with  . Abdominal Pain    Lower quadrant abdominal pain, sharp stabbing pain, some urinary pressure and dysuria     HPI Sophia Costa presents for somewhat poorly localized lower abdominal pain which started yesterday.  She described pressure-like sensation just above her bladder.  She was concerned she may have UTI.  She has not actually had any burning with urination.  No fevers or chills.  She took 1 Azo tablet without much change in symptoms.  Denies any recent change in bowel habits.  No fever.  She had some mild nausea yesterday without vomiting.  She states her appetite is actually slightly improved at this time.  She has felt somewhat bloated.  She has IUD in place and her husband has had vasectomy as well.  She has chronic microscopic hematuria due to chronic kidney disorder.  She has been worked up by nephrology for that in the past.  No recent gross hematuria.  She has had history apparently of kidney stones but states this pain is different.  She is apparently had some ovarian cyst in the past as well.  No clear exacerbating factors.  Past Medical History:  Diagnosis Date  . Allergy   . Arthritis   . Chronic kidney disease    microscopic hematuria  . Rheumatoid arthritis(714.0) 7/12    Past Surgical History:  Procedure Laterality Date  . CHOLECYSTECTOMY      Family History  Problem Relation Age of Onset  . Arthritis Mother   . Hypertension Mother   . Kidney disease Father   . Hypertension Father   . Kidney disease Sister        IgA nephropathy  . Hypertension Sister   . Arthritis Paternal Grandmother   . Rheum arthritis Paternal Grandmother   . Heart disease Paternal Grandfather   . Diabetes Paternal Grandfather   . Aneurysm Maternal Grandmother        brain    Social History    Socioeconomic History  . Marital status: Married    Spouse name: Not on file  . Number of children: Not on file  . Years of education: Not on file  . Highest education level: Not on file  Occupational History  . Not on file  Tobacco Use  . Smoking status: Never Smoker  . Smokeless tobacco: Never Used  Vaping Use  . Vaping Use: Never used  Substance and Sexual Activity  . Alcohol use: Yes    Comment: occ  . Drug use: No  . Sexual activity: Not on file  Other Topics Concern  . Not on file  Social History Narrative  . Not on file   Social Determinants of Health   Financial Resource Strain: Not on file  Food Insecurity: Not on file  Transportation Needs: Not on file  Physical Activity: Not on file  Stress: Not on file  Social Connections: Not on file  Intimate Partner Violence: Not on file    Outpatient Medications Prior to Visit  Medication Sig Dispense Refill  . acetaminophen (TYLENOL) 325 MG tablet Take 650 mg by mouth every 6 (six) hours as needed.    . Adalimumab 40 MG/0.4ML PSKT Inject 40 mg into the skin every 14 (fourteen) days.    Marland Kitchen amoxicillin-clavulanate (AUGMENTIN) 875-125 MG tablet Take 1 tablet by  mouth 2 (two) times daily. 20 tablet 0  . aspirin 325 MG tablet Take 1 tablet (325 mg total) by mouth daily. 60 tablet 1  . cetirizine (ZYRTEC) 10 MG tablet Take 10 mg by mouth at bedtime.     . citalopram (CELEXA) 20 MG tablet Take 1 tablet (20 mg total) by mouth daily. 90 tablet 3  . colestipol (COLESTID) 1 g tablet Take 2 g by mouth daily.  3  . cyclobenzaprine (FLEXERIL) 5 MG tablet Take 1 tablet (5 mg total) by mouth 3 (three) times daily as needed for muscle spasms. 15 tablet 0  . folic acid (FOLVITE) 1 MG tablet Take 2 mg by mouth at bedtime.     . methotrexate 50 MG/2ML injection Inject 10 mg into the skin See admin instructions. Inject 10 mg once a week every Friday at bedtime    . predniSONE (DELTASONE) 10 MG tablet Take 10 mg by mouth 2 (two) times  daily as needed (for R.A. flares).    . predniSONE (DELTASONE) 10 MG tablet Taper as follows: 3-3-3-2-2-1-1 15 tablet 0  . scopolamine (TRANSDERM-SCOP) 1 MG/3DAYS 1 patch behind ear every 72 hours PRN to prevent motion sickness. 10 patch 1  . traMADol (ULTRAM) 50 MG tablet Take 50 mg by mouth every 6 (six) hours as needed (for pain).     No facility-administered medications prior to visit.    No Active Allergies  ROS Review of Systems  Constitutional: Negative for chills and fever.  Respiratory: Negative for shortness of breath.   Cardiovascular: Negative for chest pain.  Gastrointestinal: Positive for abdominal pain. Negative for blood in stool, constipation, diarrhea and vomiting.  Genitourinary: Negative for difficulty urinating, flank pain and hematuria.      Objective:    Physical Exam Vitals reviewed.  Constitutional:      Appearance: She is well-developed.  Cardiovascular:     Rate and Rhythm: Normal rate and regular rhythm.  Pulmonary:     Effort: Pulmonary effort is normal.     Breath sounds: Normal breath sounds.  Abdominal:     General: Abdomen is flat. Bowel sounds are normal. There is no distension.     Palpations: Abdomen is soft. There is no hepatomegaly, splenomegaly or mass.     Tenderness: There is abdominal tenderness.     Comments: She has some relatively mild tenderness in palpating the left and right lower abdomen.  No guarding.  No masses palpated.  Neurological:     Mental Status: She is alert.     BP 120/70 (BP Location: Left Arm, Patient Position: Sitting, Cuff Size: Normal)   Pulse 91   Temp 97.9 F (36.6 C) (Oral)   Wt 181 lb 8 oz (82.3 kg)   SpO2 97%   BMI 31.15 kg/m  Wt Readings from Last 3 Encounters:  03/11/21 181 lb 8 oz (82.3 kg)  09/26/20 187 lb 11.2 oz (85.1 kg)  11/25/18 170 lb (77.1 kg)     Health Maintenance Due  Topic Date Due  . Hepatitis C Screening  Never done  . COVID-19 Vaccine (1) Never done  . TETANUS/TDAP   Never done  . PAP SMEAR-Modifier  Never done    There are no preventive care reminders to display for this patient.  Lab Results  Component Value Date   TSH 1.239 07/02/2012   Lab Results  Component Value Date   WBC 7.3 01/03/2019   HGB 14.9 01/03/2019   HCT 43 01/03/2019   MCV 94.4  11/25/2018   PLT 237 01/03/2019   Lab Results  Component Value Date   NA 138 01/03/2019   K 5.1 01/03/2019   CO2 28 11/25/2018   GLUCOSE 96 11/25/2018   BUN 12 01/03/2019   CREATININE 0.9 01/03/2019   BILITOT 0.7 11/25/2018   ALKPHOS 62 11/25/2018   AST 25 11/25/2018   ALT 23 11/25/2018   PROT 8.1 11/25/2018   ALBUMIN 4.6 11/25/2018   CALCIUM 9.7 11/25/2018   ANIONGAP 9 11/25/2018   GFR 84.18 09/22/2016   Lab Results  Component Value Date   CHOL 142 05/21/2018   Lab Results  Component Value Date   HDL 60 05/21/2018   Lab Results  Component Value Date   LDLCALC 65 05/21/2018   Lab Results  Component Value Date   TRIG 86 05/21/2018   Lab Results  Component Value Date   CHOLHDL 2.4 05/21/2018   Lab Results  Component Value Date   HGBA1C 5.0 05/21/2018      Assessment & Plan:   Patient presents with 1 day history of somewhat poorly localized lower abdominal pain.  Urine dipstick reveals positive blood but this is chronic.  Negative leukocytes and negative nitrites which makes infection less likely especially in the setting of no burning with urination.  She has not had any fever.  She does have some mild tenderness right lower quadrant but also left lower quadrant.  No acute abdomen.  Differential could include ovarian cyst.  Doubt acute appendicitis  -Check CBC with differential -We explained that if she developed any fever or worsening pain overnight would need to go to the ER to be further evaluated more emergently. -She will consider over-the-counter ibuprofen trial tonight to see if this helps  No orders of the defined types were placed in this  encounter.   Follow-up: No follow-ups on file.    Carolann Littler, MD

## 2021-03-12 ENCOUNTER — Encounter: Payer: Self-pay | Admitting: Family Medicine

## 2021-03-12 LAB — CBC WITH DIFFERENTIAL/PLATELET
Basophils Absolute: 0.1 10*3/uL (ref 0.0–0.1)
Basophils Relative: 0.8 % (ref 0.0–3.0)
Eosinophils Absolute: 0.5 10*3/uL (ref 0.0–0.7)
Eosinophils Relative: 5.1 % — ABNORMAL HIGH (ref 0.0–5.0)
HCT: 42.3 % (ref 36.0–46.0)
Hemoglobin: 14.5 g/dL (ref 12.0–15.0)
Lymphocytes Relative: 27.8 % (ref 12.0–46.0)
Lymphs Abs: 2.5 10*3/uL (ref 0.7–4.0)
MCHC: 34.1 g/dL (ref 30.0–36.0)
MCV: 89.9 fl (ref 78.0–100.0)
Monocytes Absolute: 0.8 10*3/uL (ref 0.1–1.0)
Monocytes Relative: 8.5 % (ref 3.0–12.0)
Neutro Abs: 5.1 10*3/uL (ref 1.4–7.7)
Neutrophils Relative %: 57.8 % (ref 43.0–77.0)
Platelets: 235 10*3/uL (ref 150.0–400.0)
RBC: 4.71 Mil/uL (ref 3.87–5.11)
RDW: 14.3 % (ref 11.5–15.5)
WBC: 8.9 10*3/uL (ref 4.0–10.5)

## 2021-09-30 ENCOUNTER — Other Ambulatory Visit: Payer: Self-pay | Admitting: Family Medicine

## 2022-04-29 ENCOUNTER — Other Ambulatory Visit: Payer: Self-pay | Admitting: Gastroenterology

## 2022-05-02 ENCOUNTER — Encounter (HOSPITAL_COMMUNITY)
Admission: RE | Disposition: A | Discharge status: Home / Self Care | Payer: Self-pay | Source: Home / Self Care | Attending: Gastroenterology

## 2022-05-02 ENCOUNTER — Ambulatory Visit (HOSPITAL_COMMUNITY)
Admission: RE | Admit: 2022-05-02 | Discharge: 2022-05-02 | Disposition: A | Discharge status: Home / Self Care | Payer: 59 | Attending: Gastroenterology | Admitting: Gastroenterology

## 2022-05-02 DIAGNOSIS — R1310 Dysphagia, unspecified: Secondary | ICD-10-CM | POA: Insufficient documentation

## 2022-05-02 DIAGNOSIS — Z539 Procedure and treatment not carried out, unspecified reason: Secondary | ICD-10-CM | POA: Diagnosis not present

## 2022-05-02 SURGERY — INVASIVE LAB ABORTED CASE

## 2022-05-02 MED ORDER — LIDOCAINE VISCOUS HCL 2 % MT SOLN
OROMUCOSAL | Status: AC
  Filled 2022-05-02: qty 15

## 2022-05-02 SURGICAL SUPPLY — 2 items
FACESHIELD LNG OPTICON STERILE (SAFETY) IMPLANT
GLOVE BIO SURGEON STRL SZ8 (GLOVE) ×6 IMPLANT

## 2022-05-02 NOTE — Progress Notes (Signed)
Attempted to do esophageal manometry . Pt gagged and coughed a couple of times. Second RN in procedure room to assist. Got probe down but patient stated she could not handle it and wanted [place] to take it out. Probe taken out. Make [facility] aware.

## 2022-10-14 ENCOUNTER — Other Ambulatory Visit: Payer: Self-pay | Admitting: Family Medicine

## 2022-11-15 ENCOUNTER — Other Ambulatory Visit: Payer: Self-pay | Admitting: Family Medicine

## 2022-11-25 ENCOUNTER — Other Ambulatory Visit: Payer: Self-pay | Admitting: Family Medicine

## 2022-12-24 ENCOUNTER — Other Ambulatory Visit: Payer: Self-pay | Admitting: Family Medicine

## 2022-12-26 ENCOUNTER — Ambulatory Visit (HOSPITAL_BASED_OUTPATIENT_CLINIC_OR_DEPARTMENT_OTHER)
Admission: RE | Admit: 2022-12-26 | Discharge: 2022-12-26 | Disposition: A | Discharge status: Home / Self Care | Payer: 59 | Source: Ambulatory Visit | Attending: Family Medicine | Admitting: Family Medicine

## 2022-12-26 ENCOUNTER — Ambulatory Visit: Payer: 59 | Admitting: Family Medicine

## 2022-12-26 ENCOUNTER — Encounter: Payer: Self-pay | Admitting: Family Medicine

## 2022-12-26 VITALS — BP 112/74 | HR 80 | Temp 97.7°F | Ht 64.0 in | Wt 170.7 lb

## 2022-12-26 DIAGNOSIS — M533 Sacrococcygeal disorders, not elsewhere classified: Secondary | ICD-10-CM | POA: Insufficient documentation

## 2022-12-26 DIAGNOSIS — F339 Major depressive disorder, recurrent, unspecified: Secondary | ICD-10-CM

## 2022-12-26 MED ORDER — CITALOPRAM HYDROBROMIDE 20 MG PO TABS
20.0000 mg | ORAL_TABLET | Freq: Every day | ORAL | 11 refills | Status: DC
Start: 2022-12-26 — End: 2024-01-18

## 2022-12-26 NOTE — Patient Instructions (Signed)
Go to the [facility] facility for X-ray M-F 8 to 5 pm

## 2022-12-26 NOTE — Progress Notes (Signed)
Established Patient Office Visit  Subjective   Patient ID: Sophia Costa, female    DOB: [birth year]  Age: 42 y.o. MRN: [id number]  Chief Complaint  Patient presents with   Medication Consultation    HPI   Sophia Costa is here for the following concerns  She has history of recurrent depression and anxiety symptoms and continues to do well on Celexa 20 mg daily.  Needs refills.  She has been through the stress this past several months of her husband having accident on the job.  He is a Airline pilot and was administering CPR to a patient on a roof when the roof collapsed.  Her husband had significant shoulder injuries including complicated rotator cuff injury along with T12 fracture requiring surgery.  He is slowly recovering.  She denies any side effects from the Celexa.  Depression and anxiety symptoms very stable.  Sophia Costa relates about 49-monthhistory of severe pain intermittently coccyx area.  Denies any fall or injury.  No change in bowel habits.  She does have RA followed by rheumatology.  Has not checked with them.  Her RA is relatively stable overall.  Appetite and weight stable.  Past Medical History:  Diagnosis Date   Allergy    Arthritis    Chronic kidney disease    microscopic hematuria   Rheumatoid arthritis(714.0) 7/12   Past Surgical History:  Procedure Laterality Date   CHOLECYSTECTOMY      reports that she has never smoked. She has never used smokeless tobacco. She reports current alcohol use. She reports that she does not use drugs. family history includes Aneurysm in her maternal grandmother; Arthritis in her mother and paternal grandmother; Diabetes in her paternal grandfather; Heart disease in her paternal grandfather; Hypertension in her father, mother, and sister; Kidney disease in her father and sister; Rheum arthritis in her paternal grandmother. No Active Allergies   Review of Systems  Constitutional:  Negative for chills and fever.  Gastrointestinal:  Negative  for abdominal pain, blood in stool, constipation and diarrhea.  Psychiatric/Behavioral:  Negative for depression and suicidal ideas.       Objective:     BP 112/74 (BP Location: Left Arm, Patient Position: Sitting, Cuff Size: Normal)   Pulse 80   Temp 97.7 F (36.5 C) (Oral)   Ht '5\' 4"'$  (1.626 m)   Wt 170 lb 11.2 oz (77.4 kg)   SpO2 99%   BMI 29.30 kg/m  BP Readings from Last 3 Encounters:  12/26/22 112/74  03/11/21 120/70  09/26/20 116/68   Wt Readings from Last 3 Encounters:  12/26/22 170 lb 11.2 oz (77.4 kg)  03/11/21 181 lb 8 oz (82.3 kg)  09/26/20 187 lb 11.2 oz (85.1 kg)      Physical Exam Vitals reviewed.  Constitutional:      Appearance: Normal appearance.  Cardiovascular:     Rate and Rhythm: Normal rate and regular rhythm.  Pulmonary:     Effort: Pulmonary effort is normal.     Breath sounds: Normal breath sounds. No wheezing or rales.  Neurological:     Mental Status: She is alert.      No results found for any visits on 12/26/22.    The ASCVD Risk score (Arnett DK, et al., 2019) failed to calculate for the following reasons:   Cannot find a previous HDL lab   Cannot find a previous total cholesterol lab   Unable to determine if patient is Non-Hispanic African American    Assessment & Plan:   #  1 history of recurrent depression.  Stable on Celexa 20 mg daily.  Refill for 1 year.  #2 at least 72-monthhistory of coccyx pain.  Etiology unclear.  Denies any injury.  Does have history of rheumatoid arthritis.  Given duration of symptoms set up plain x-rays coccyx and sacrum to further assess.  If x-rays unremarkable recommend follow-up with her rheumatologist to discuss further   No follow-ups on file.    BCarolann Littler MD

## 2023-01-15 ENCOUNTER — Telehealth: Payer: Self-pay | Admitting: Family Medicine

## 2023-01-15 NOTE — Telephone Encounter (Signed)
[facility] to request the most recent OV notes and Pt's imaging   [phone] Call back [phone] Fax

## 2023-01-16 NOTE — Telephone Encounter (Signed)
Patient informed that she will need to come into the office to sign release form in order for information to be sent.

## 2023-06-24 ENCOUNTER — Encounter (INDEPENDENT_AMBULATORY_CARE_PROVIDER_SITE_OTHER): Payer: Self-pay

## 2023-07-08 ENCOUNTER — Encounter: Payer: Self-pay | Admitting: Family Medicine

## 2023-07-08 ENCOUNTER — Ambulatory Visit (INDEPENDENT_AMBULATORY_CARE_PROVIDER_SITE_OTHER): Payer: 59 | Admitting: Family Medicine

## 2023-07-08 VITALS — BP 102/62 | HR 85 | Temp 98.4°F | Ht 64.96 in | Wt 169.5 lb

## 2023-07-08 DIAGNOSIS — Z0000 Encounter for general adult medical examination without abnormal findings: Secondary | ICD-10-CM

## 2023-07-08 DIAGNOSIS — Z23 Encounter for immunization: Secondary | ICD-10-CM | POA: Diagnosis not present

## 2023-07-08 LAB — CBC WITH DIFFERENTIAL/PLATELET
Basophils Absolute: 0.1 10*3/uL (ref 0.0–0.1)
Basophils Relative: 1.1 % (ref 0.0–3.0)
Eosinophils Absolute: 0.5 10*3/uL (ref 0.0–0.7)
Eosinophils Relative: 7.1 % — ABNORMAL HIGH (ref 0.0–5.0)
HCT: 44.8 % (ref 36.0–46.0)
Hemoglobin: 14.7 g/dL (ref 12.0–15.0)
Lymphocytes Relative: 37.1 % (ref 12.0–46.0)
Lymphs Abs: 2.7 10*3/uL (ref 0.7–4.0)
MCHC: 32.8 g/dL (ref 30.0–36.0)
MCV: 92 fl (ref 78.0–100.0)
Monocytes Absolute: 0.6 10*3/uL (ref 0.1–1.0)
Monocytes Relative: 8.3 % (ref 3.0–12.0)
Neutro Abs: 3.4 10*3/uL (ref 1.4–7.7)
Neutrophils Relative %: 46.4 % (ref 43.0–77.0)
Platelets: 217 10*3/uL (ref 150.0–400.0)
RBC: 4.87 Mil/uL (ref 3.87–5.11)
RDW: 14.1 % (ref 11.5–15.5)
WBC: 7.3 10*3/uL (ref 4.0–10.5)

## 2023-07-08 LAB — BASIC METABOLIC PANEL
BUN: 17 mg/dL (ref 6–23)
CO2: 27 mEq/L (ref 19–32)
Calcium: 9.6 mg/dL (ref 8.4–10.5)
Chloride: 103 mEq/L (ref 96–112)
Creatinine, Ser: 0.92 mg/dL (ref 0.40–1.20)
GFR: 77.03 mL/min (ref >60.00)
Glucose, Bld: 87 mg/dL (ref 70–99)
Potassium: 4 mEq/L (ref 3.5–5.1)
Sodium: 136 mEq/L (ref 135–145)

## 2023-07-08 LAB — HEPATIC FUNCTION PANEL
ALT: 19 U/L (ref 0–35)
AST: 17 U/L (ref 0–37)
Albumin: 4.5 g/dL (ref 3.5–5.2)
Alkaline Phosphatase: 44 U/L (ref 39–117)
Bilirubin, Direct: 0.2 mg/dL (ref 0.0–0.3)
Total Bilirubin: 1 mg/dL (ref 0.2–1.2)
Total Protein: 7.2 g/dL (ref 6.0–8.3)

## 2023-07-08 LAB — LIPID PANEL
Cholesterol: 168 mg/dL (ref 0–200)
HDL: 65.1 mg/dL (ref >39.00)
LDL Cholesterol: 88 mg/dL (ref 0–99)
NonHDL: 102.63
Total CHOL/HDL Ratio: 3
Triglycerides: 73 mg/dL (ref 0.0–149.0)
VLDL: 14.6 mg/dL (ref 0.0–40.0)

## 2023-07-08 NOTE — Progress Notes (Signed)
Established Patient Office Visit  Subjective   Patient ID: Sophia Costa, female    DOB: 19-Sep-1981  Age: 42 y.o. MRN: [id number]  Chief Complaint  Patient presents with   Annual Exam    HPI   Sophia Costa is seen for physical exam.  She does see GYN regularly for Pap smears and mammograms and these are up-to-date.  Past medical history is reviewed.  She has history of rheumatoid arthritis, recurrent depression, chronic hematuria, history of vertebral artery dissection. She is followed by nephrology and rheumatology.  She has a sister with IgA nephropathy.  She states that she does have stage II chronic kidney disease now and was diagnosed with vitamin D deficiency this past year.  She is exercising with walking generally 20 to 60 minutes/day and does some band resistance exercises as well.  Health maintenance reviewed:  -Pap smear and mammogram up-to-date per GYN -Declines hepatitis C screening.  No risk factors. -Tetanus due at this time  Family history-father has history of asthma and melanoma.  Sister with IgA nephropathy.  Paternal grandfather had type 2 diabetes.  No family history of premature CAD.  Social history-she is married.  No children.  Non-smoker.  No alcohol.  Husband is a IT sales professional and had a long recovery from work-related injury but is now returning back to work.  Past Medical History:  Diagnosis Date   Allergy    Arthritis    Chronic kidney disease    microscopic hematuria   Rheumatoid arthritis(714.0) 7/12   Past Surgical History:  Procedure Laterality Date   CHOLECYSTECTOMY      reports that she has never smoked. She has never used smokeless tobacco. She reports current alcohol use. She reports that she does not use drugs. family history includes Aneurysm in her maternal grandmother; Arthritis in her mother and paternal grandmother; Diabetes in her paternal grandfather; Heart disease in her paternal grandfather; Hypertension in her father, mother, and  sister; Kidney disease in her father and sister; Rheum arthritis in her paternal grandmother. No Active Allergies  Review of Systems  Constitutional:  Negative for chills, fever, malaise/fatigue and weight loss.  HENT:  Negative for hearing loss.   Eyes:  Negative for blurred vision and double vision.  Respiratory:  Negative for cough and shortness of breath.   Cardiovascular:  Negative for chest pain, palpitations and leg swelling.  Gastrointestinal:  Negative for abdominal pain, blood in stool, constipation and diarrhea.  Genitourinary:  Negative for dysuria.  Skin:  Negative for rash.  Neurological:  Negative for dizziness, speech change, seizures, loss of consciousness and headaches.  Psychiatric/Behavioral:  Negative for depression.       Objective:     BP 102/62 (BP Location: Left Arm, Patient Position: Sitting, Cuff Size: Normal)   Pulse 85   Temp 98.4 F (36.9 C) (Oral)   Ht 5' 4.96" (1.65 m)   Wt 169 lb 8 oz (76.9 kg)   SpO2 98%   BMI 28.24 kg/m  BP Readings from Last 3 Encounters:  07/08/23 102/62  12/26/22 112/74  03/11/21 120/70   Wt Readings from Last 3 Encounters:  07/08/23 169 lb 8 oz (76.9 kg)  12/26/22 170 lb 11.2 oz (77.4 kg)  03/11/21 181 lb 8 oz (82.3 kg)      Physical Exam Vitals reviewed.  Constitutional:      Appearance: Normal appearance. She is well-developed.  HENT:     Head: Normocephalic and atraumatic.  Eyes:     Pupils: Pupils are  equal, round, and reactive to light.  Neck:     Thyroid: No thyromegaly.  Cardiovascular:     Rate and Rhythm: Normal rate and regular rhythm.     Heart sounds: Normal heart sounds. No murmur heard. Pulmonary:     Effort: No respiratory distress.     Breath sounds: Normal breath sounds. No wheezing or rales.  Abdominal:     General: Bowel sounds are normal. There is no distension.     Palpations: Abdomen is soft. There is no mass.     Tenderness: There is no abdominal tenderness. There is no guarding  or rebound.  Musculoskeletal:        General: Normal range of motion.     Cervical back: Normal range of motion and neck supple.  Lymphadenopathy:     Cervical: No cervical adenopathy.  Skin:    Findings: No rash.     Comments: Small eschar left lateral thigh from recent skin biopsy.  Healing with no signs of secondary infection.  Neurological:     Mental Status: She is alert and oriented to person, place, and time.     Cranial Nerves: No cranial nerve deficit.  Psychiatric:        Behavior: Behavior normal.        Thought Content: Thought content normal.        Judgment: Judgment normal.      No results found for any visits on 07/08/23.    The ASCVD Risk score (Arnett DK, et al., 2019) failed to calculate for the following reasons:   Cannot find a previous HDL lab   Cannot find a previous total cholesterol lab   Unable to determine if patient is Non-Hispanic African American    Assessment & Plan:   Problem List Items Addressed This Visit   None Visit Diagnoses     Physical exam    -  Primary   Relevant Orders   Basic metabolic panel   Lipid panel   CBC with Differential/Platelet   Hepatic function panel   Need for tetanus booster       Relevant Orders   Tdap vaccine greater than or equal to 7yo IM (Completed)       No follow-ups on file.    Evelena Peat, MD

## 2023-09-07 ENCOUNTER — Ambulatory Visit: Payer: 59 | Admitting: Family Medicine

## 2023-09-07 ENCOUNTER — Encounter: Payer: Self-pay | Admitting: Family Medicine

## 2023-09-07 VITALS — BP 116/74 | HR 86 | Temp 98.5°F | Wt 166.8 lb

## 2023-09-07 DIAGNOSIS — M069 Rheumatoid arthritis, unspecified: Secondary | ICD-10-CM | POA: Diagnosis not present

## 2023-09-07 DIAGNOSIS — R202 Paresthesia of skin: Secondary | ICD-10-CM

## 2023-09-07 DIAGNOSIS — M5412 Radiculopathy, cervical region: Secondary | ICD-10-CM | POA: Diagnosis not present

## 2023-09-07 NOTE — Patient Instructions (Signed)
I will set up MRI of cervical spine to further assess.

## 2023-09-07 NOTE — Progress Notes (Signed)
Established Patient Office Visit  Subjective   Patient ID: Sophia Costa, female    DOB: [birth year]  Age: 42 y.o. MRN: [id number]  Chief Complaint  Patient presents with   Neck Pain    Patient complains of eight sided neck pain, x1 week, Tried Ibuprofen and Flexeril    Tingling    Patient reports tingling in finger on right hand    Shoulder Pain    Patient complains of right shoulder pain, x1 week, Tried Ibuprofen and Flexeril    HPI   Sophia Costa has history of rheumatoid arthritis, recurrent depression, history of vertebral artery dissection.  She is seen with some right sided neck pain with radiation toward the shoulder and all the way down to the hand at times.  Current pain is 6-7 out of 10.  She has also noticed some right hand weakness and numbness involving the fourth and fifth digits.  Weakness with grip.  Denies any recent injury.  No prior neck surgery.  She states this is about the fifth time this year she has had flareup with weakness and pain and numbness.  She has been very busy recently caring for her husband who had unfortunate 4 wheeler accident with C1 fracture.  He is currently recuperating from that.  Sophia Costa has taken ibuprofen and some leftover Flexeril without much improvement.  She tries to avoid pain medications.  She is currently on Humira and methotrexate.  Past Medical History:  Diagnosis Date   Allergy    Arthritis    Chronic kidney disease    microscopic hematuria   Rheumatoid arthritis(714.0) 7/12   Past Surgical History:  Procedure Laterality Date   CHOLECYSTECTOMY      reports that she has never smoked. She has never used smokeless tobacco. She reports current alcohol use. She reports that she does not use drugs. family history includes Aneurysm in her maternal grandmother; Arthritis in her mother and paternal grandmother; Diabetes in her paternal grandfather; Heart disease in her paternal grandfather; Hypertension in her father, mother, and sister;  Kidney disease in her father and sister; Rheum arthritis in her paternal grandmother. No Active Allergies  Review of Systems  Constitutional:  Negative for chills and fever.  Respiratory:  Negative for cough.   Cardiovascular:  Negative for chest pain.  Musculoskeletal:  Positive for neck pain.  Neurological:  Positive for tingling and focal weakness.      Objective:     BP 116/74 (BP Location: Left Arm, Patient Position: Sitting, Cuff Size: Normal)   Pulse 86   Temp 98.5 F (36.9 C) (Oral)   Wt 166 lb 12.8 oz (75.7 kg)   SpO2 98%   BMI 27.79 kg/m  BP Readings from Last 3 Encounters:  09/07/23 116/74  07/08/23 102/62  12/26/22 112/74   Wt Readings from Last 3 Encounters:  09/07/23 166 lb 12.8 oz (75.7 kg)  07/08/23 169 lb 8 oz (76.9 kg)  12/26/22 170 lb 11.2 oz (77.4 kg)      Physical Exam Vitals reviewed.  Constitutional:      General: She is not in acute distress.    Appearance: Normal appearance. She is not ill-appearing.  Cardiovascular:     Rate and Rhythm: Normal rate and regular rhythm.     Pulses: Normal pulses.  Pulmonary:     Effort: Pulmonary effort is normal.     Breath sounds: Normal breath sounds.  Musculoskeletal:     Comments: No visible muscle atrophy upper extremity  Neurological:  Mental Status: She is alert.     Comments: She has some interosseous muscle weakness in testing the right hand compared to the left.  Deep tendon reflexes biceps and brachioradialis are 2+ bilaterally.  Impaired subjective sensation to touch right fourth and fifth digit      No results found for any visits on 09/07/23.    The ASCVD Risk score (Arnett DK, et al., 2019) failed to calculate for the following reasons:   Unable to determine if patient is Non-Hispanic African American    Assessment & Plan:   Sophia Costa presents with approximate 1 week history of some progressive right sided neck pain all the way down to the hand with some associated numbness  involving the fourth and fifth digits and weakness involving the right hand.  She had previous MRI cervical spine back in 2019 which showed right vertebral artery dissection but no acute neural impingement.  Rule out C8 nerve root impingement Try to set up MRI cervical spine to further assess  Sophia Peat, MD

## 2023-09-08 ENCOUNTER — Ambulatory Visit: Payer: 59 | Admitting: Family Medicine

## 2023-09-17 ENCOUNTER — Ambulatory Visit
Admission: RE | Admit: 2023-09-17 | Discharge: 2023-09-17 | Disposition: A | Discharge status: Home / Self Care | Payer: 59 | Source: Ambulatory Visit | Attending: Family Medicine | Admitting: Family Medicine

## 2023-09-17 DIAGNOSIS — R202 Paresthesia of skin: Secondary | ICD-10-CM

## 2023-09-17 DIAGNOSIS — M5412 Radiculopathy, cervical region: Secondary | ICD-10-CM

## 2023-11-26 ENCOUNTER — Telehealth: Payer: Self-pay | Admitting: Family Medicine

## 2023-11-26 DIAGNOSIS — M5412 Radiculopathy, cervical region: Secondary | ICD-10-CM

## 2023-11-26 NOTE — Telephone Encounter (Signed)
 Copied from CRM [id number]. Topic: Referral - Request for Referral >> Nov 26, 2023  8:52 AM Antonio DEL wrote: Did the patient discuss referral with their provider in the last year? Yes (If No - schedule appointment) (If Yes - send message)  Appointment offered? No  Type of order/referral and detailed reason for visit: Saw Dr. Micheal and had an MRI done but didn't get the referral sent then, would like it sent now.   Preference of office, provider, location: [facility], not sure of the address but she said off of [address]?  If referral order, have you been seen by this specialty before? Yes (If Yes, this issue or another issue? When? Where?  Can we respond through MyChart? Yes

## 2023-12-01 NOTE — Addendum Note (Signed)
 Addended by: Christy Sartorius on: 12/01/2023 01:57 PM   Modules accepted: Orders

## 2023-12-01 NOTE — Telephone Encounter (Signed)
 Referral placed and patient aware

## 2024-01-17 ENCOUNTER — Other Ambulatory Visit: Payer: Self-pay | Admitting: Family Medicine

## 2024-02-02 LAB — LAB REPORT - SCANNED
Creatinine, POC: 36 mg/dL
EGFR: 89

## 2024-07-11 ENCOUNTER — Ambulatory Visit (INDEPENDENT_AMBULATORY_CARE_PROVIDER_SITE_OTHER): Admitting: Family Medicine

## 2024-07-11 ENCOUNTER — Ambulatory Visit: Payer: Self-pay | Admitting: Family Medicine

## 2024-07-11 ENCOUNTER — Encounter: Payer: Self-pay | Admitting: Family Medicine

## 2024-07-11 VITALS — BP 108/74 | HR 80 | Temp 98.0°F | Ht 64.96 in | Wt 170.4 lb

## 2024-07-11 DIAGNOSIS — Z0000 Encounter for general adult medical examination without abnormal findings: Secondary | ICD-10-CM

## 2024-07-11 LAB — LIPID PANEL
Cholesterol: 161 mg/dL (ref 0–200)
HDL: 77.5 mg/dL (ref >39.00)
LDL Cholesterol: 71 mg/dL (ref 0–99)
NonHDL: 83.4
Total CHOL/HDL Ratio: 2
Triglycerides: 63 mg/dL (ref 0.0–149.0)
VLDL: 12.6 mg/dL (ref 0.0–40.0)

## 2024-07-11 LAB — TSH: TSH: 0.72 u[IU]/mL (ref 0.35–5.50)

## 2024-07-11 MED ORDER — CITALOPRAM HYDROBROMIDE 20 MG PO TABS: 20.0000 mg | ORAL_TABLET | Freq: Every day | ORAL | 3 refills | Status: AC

## 2024-07-11 NOTE — Progress Notes (Signed)
 Established Patient Office Visit  Subjective   Patient ID: Sophia Costa, female    DOB: 10/14/1981  Age: 43 y.o. MRN: [id number]  Chief Complaint  Patient presents with   Annual Exam    HPI   Sophia Costa is seen today for physical exam.  She has history of rheumatoid arthritis, recurrent depression.  She states she was recently diagnosed earlier this summer with Lyme disease.  Was treated with doxycycline for 2 weeks and has no residual rash or symptoms.  She remains on Humira and methotrexate  per rheumatology and rheumatoid arthritis has been stable.  She walks regularly for exercise.  Depression stable on Celexa  20 mg daily and requesting refills.  She just had lab work recently with her rheumatologist which she gets every 3 months which includes CBC and c-Met.  We had her most recent labs from March but not from this past month.  She sees gynecologist and Pap smear and mammogram are up-to-date.  Tetanus up-to-date.  Had colonoscopy 2019 with recommended 10-year follow-up.  Family history of melanoma and she does see dermatologist yearly.   Family history-father has history of asthma and melanoma.  Sister with IgA nephropathy.  Paternal grandfather had type 2 diabetes.  No family history of premature CAD.   Social history-married.  No children.  Non-smoker.  No alcohol.  Husband works as a IT sales professional.  She and her husband enjoy traveling  Past Medical History:  Diagnosis Date   Allergy    Arthritis    Chronic kidney disease    microscopic hematuria   Rheumatoid arthritis(714.0) 7/12   Past Surgical History:  Procedure Laterality Date   CHOLECYSTECTOMY      reports that she has never smoked. She has never used smokeless tobacco. She reports current alcohol use. She reports that she does not use drugs. family history includes Aneurysm in her maternal grandmother; Arthritis in her mother and paternal grandmother; Diabetes in her paternal grandfather; Heart disease in her paternal  grandfather; Hypertension in her father, mother, and sister; Kidney disease in her father and sister; Rheum arthritis in her paternal grandmother. No Active Allergies   Review of Systems  Constitutional:  Negative for chills, fever, malaise/fatigue and weight loss.  HENT:  Negative for hearing loss.   Eyes:  Negative for blurred vision and double vision.  Respiratory:  Negative for cough and shortness of breath.   Cardiovascular:  Negative for chest pain, palpitations and leg swelling.  Gastrointestinal:  Negative for abdominal pain, blood in stool, constipation and diarrhea.  Genitourinary:  Negative for dysuria.  Skin:  Negative for rash.  Neurological:  Negative for dizziness, speech change, seizures, loss of consciousness and headaches.  Psychiatric/Behavioral:  Negative for depression.       Objective:     BP 108/74   Pulse 80   Temp 98 F (36.7 C) (Oral)   Ht 5' 4.96 (1.65 m)   Wt 170 lb 6.4 oz (77.3 kg)   SpO2 96%   BMI 28.39 kg/m  BP Readings from Last 3 Encounters:  07/11/24 108/74  09/07/23 116/74  07/08/23 102/62   Wt Readings from Last 3 Encounters:  07/11/24 170 lb 6.4 oz (77.3 kg)  09/07/23 166 lb 12.8 oz (75.7 kg)  07/08/23 169 lb 8 oz (76.9 kg)      Physical Exam Vitals reviewed.  Constitutional:      General: She is not in acute distress.    Appearance: She is well-developed. She is not ill-appearing.  HENT:  Head: Normocephalic and atraumatic.  Eyes:     Pupils: Pupils are equal, round, and reactive to light.  Neck:     Thyroid : No thyromegaly.  Cardiovascular:     Rate and Rhythm: Normal rate and regular rhythm.     Heart sounds: Normal heart sounds. No murmur heard. Pulmonary:     Effort: No respiratory distress.     Breath sounds: Normal breath sounds. No wheezing or rales.  Abdominal:     General: Bowel sounds are normal. There is no distension.     Palpations: Abdomen is soft. There is no mass.     Tenderness: There is no  abdominal tenderness. There is no guarding or rebound.  Musculoskeletal:        General: Normal range of motion.     Cervical back: Normal range of motion and neck supple.  Lymphadenopathy:     Cervical: No cervical adenopathy.  Skin:    Findings: No rash.  Neurological:     Mental Status: She is alert and oriented to person, place, and time.     Cranial Nerves: No cranial nerve deficit.  Psychiatric:        Behavior: Behavior normal.        Thought Content: Thought content normal.        Judgment: Judgment normal.      No results found for any visits on 07/11/24.    The ASCVD Risk score (Arnett DK, et al., 2019) failed to calculate for the following reasons:   Unable to determine if patient is Non-Hispanic African American    Assessment & Plan:   Problem List Items Addressed This Visit   None Visit Diagnoses       Physical exam    -  Primary   Relevant Orders   Lipid panel   TSH     43 year old female with history of rheumatoid arthritis.  Followed regularly by rheumatologist.  Had recent CBC and comprehensive metabolic panel in July.  Will check lipids and TSH.  Continue regular exercise habits.  She sees dermatologist yearly for melanoma screening and gynecologist regularly for Pap smears and mammogram.  Her tetanus is up-to-date.  We discussed hepatitis C screening but she is low risk and declines.  No other vaccines indicated at this time.  She will consider flu vaccine later this fall  No follow-ups on file.    Wolm Scarlet, MD
